# Patient Record
Sex: Female | Born: 1960
Health system: Southern US, Community
[De-identification: ages and names within clinical notes are randomized; demographics above are authoritative.]

## PROBLEM LIST (undated history)

## (undated) ENCOUNTER — Ambulatory Visit (HOSPITAL_COMMUNITY): Admission: EM | Payer: Self-pay | Source: Home / Self Care

## (undated) DIAGNOSIS — T7840XA Allergy, unspecified, initial encounter: Secondary | ICD-10-CM

## (undated) DIAGNOSIS — J302 Other seasonal allergic rhinitis: Secondary | ICD-10-CM

## (undated) DIAGNOSIS — R002 Palpitations: Secondary | ICD-10-CM

## (undated) DIAGNOSIS — F419 Anxiety disorder, unspecified: Secondary | ICD-10-CM

## (undated) DIAGNOSIS — Z973 Presence of spectacles and contact lenses: Secondary | ICD-10-CM

## (undated) DIAGNOSIS — M1990 Unspecified osteoarthritis, unspecified site: Secondary | ICD-10-CM

## (undated) DIAGNOSIS — I10 Essential (primary) hypertension: Secondary | ICD-10-CM

## (undated) DIAGNOSIS — K219 Gastro-esophageal reflux disease without esophagitis: Secondary | ICD-10-CM

## (undated) DIAGNOSIS — D649 Anemia, unspecified: Secondary | ICD-10-CM

## (undated) HISTORY — DX: Gastro-esophageal reflux disease without esophagitis: K21.9

## (undated) HISTORY — DX: Anxiety disorder, unspecified: F41.9

## (undated) HISTORY — PX: COLONOSCOPY: SHX174

## (undated) HISTORY — DX: Unspecified osteoarthritis, unspecified site: M19.90

## (undated) HISTORY — DX: Palpitations: R00.2

## (undated) HISTORY — PX: TUBAL LIGATION: SHX77

## (undated) HISTORY — DX: Anemia, unspecified: D64.9

## (undated) HISTORY — DX: Allergy, unspecified, initial encounter: T78.40XA

---

## 1988-02-20 HISTORY — PX: BREAST SURGERY: SHX581

## 1997-09-18 ENCOUNTER — Emergency Department (HOSPITAL_COMMUNITY): Admission: EM | Admit: 1997-09-18 | Discharge: 1997-09-18 | Payer: Self-pay | Admitting: Emergency Medicine

## 2000-05-20 ENCOUNTER — Other Ambulatory Visit: Admission: RE | Admit: 2000-05-20 | Discharge: 2000-05-20 | Payer: Self-pay | Admitting: Obstetrics & Gynecology

## 2001-06-11 ENCOUNTER — Other Ambulatory Visit: Admission: RE | Admit: 2001-06-11 | Discharge: 2001-06-11 | Payer: Self-pay | Admitting: Obstetrics & Gynecology

## 2001-07-07 ENCOUNTER — Encounter (INDEPENDENT_AMBULATORY_CARE_PROVIDER_SITE_OTHER): Payer: Self-pay

## 2001-07-07 ENCOUNTER — Ambulatory Visit (HOSPITAL_COMMUNITY): Admission: RE | Admit: 2001-07-07 | Discharge: 2001-07-07 | Payer: Self-pay | Admitting: Obstetrics & Gynecology

## 2001-09-07 ENCOUNTER — Emergency Department (HOSPITAL_COMMUNITY): Admission: EM | Admit: 2001-09-07 | Discharge: 2001-09-07 | Payer: Self-pay | Admitting: Emergency Medicine

## 2001-09-07 ENCOUNTER — Encounter: Payer: Self-pay | Admitting: Emergency Medicine

## 2007-12-13 ENCOUNTER — Emergency Department (HOSPITAL_BASED_OUTPATIENT_CLINIC_OR_DEPARTMENT_OTHER): Admission: EM | Admit: 2007-12-13 | Discharge: 2007-12-13 | Payer: Self-pay | Admitting: Emergency Medicine

## 2008-05-11 ENCOUNTER — Emergency Department (HOSPITAL_COMMUNITY): Admission: EM | Admit: 2008-05-11 | Discharge: 2008-05-11 | Payer: Self-pay | Admitting: Emergency Medicine

## 2010-06-01 LAB — BASIC METABOLIC PANEL
BUN: 9 mg/dL (ref 6–23)
CO2: 27 mEq/L (ref 19–32)
Calcium: 8.9 mg/dL (ref 8.4–10.5)
Chloride: 107 mEq/L (ref 96–112)
Creatinine, Ser: 0.53 mg/dL (ref 0.4–1.2)
GFR calc Af Amer: >60 mL/min (ref >60)
GFR calc non Af Amer: >60 mL/min (ref >60)
Glucose, Bld: 94 mg/dL (ref 70–99)
Potassium: 4 mEq/L (ref 3.5–5.1)
Sodium: 138 mEq/L (ref 135–145)

## 2010-06-01 LAB — CBC
HCT: 36.1 % (ref 36.0–46.0)
Hemoglobin: 11.5 g/dL — ABNORMAL LOW (ref 12.0–15.0)
MCHC: 31.9 g/dL (ref 30.0–36.0)
MCV: 82.3 fL (ref 78.0–100.0)
Platelets: 240 10*3/uL (ref 150–400)
RBC: 4.38 MIL/uL (ref 3.87–5.11)
RDW: 13.6 % (ref 11.5–15.5)
WBC: 4.7 10*3/uL (ref 4.0–10.5)

## 2010-06-01 LAB — DIFFERENTIAL
Basophils Absolute: 0 10*3/uL (ref 0.0–0.1)
Basophils Relative: 1 % (ref 0–1)
Eosinophils Absolute: 0.1 10*3/uL (ref 0.0–0.7)
Eosinophils Relative: 2 % (ref 0–5)
Lymphocytes Relative: 36 % (ref 12–46)
Lymphs Abs: 1.7 10*3/uL (ref 0.7–4.0)
Monocytes Absolute: 0.2 10*3/uL (ref 0.1–1.0)
Monocytes Relative: 5 % (ref 3–12)
Neutro Abs: 2.7 10*3/uL (ref 1.7–7.7)
Neutrophils Relative %: 56 % (ref 43–77)

## 2010-06-01 LAB — CK TOTAL AND CKMB (NOT AT ARMC): Relative Index: 0.9 (ref 0.0–2.5)

## 2010-07-07 NOTE — Op Note (Signed)
[facility]  Patient:    Julie Mcdonald, Julie Mcdonald Visit Number: [id number] MRN: [id number]          Service Type: DSU Location: [facility] Attending Physician:  Lars Pinks Dictated by:   Caralyn Guile. Arlyce Dice, M.D. Proc. Date: 07/07/01 Admit Date:  07/07/2001 Discharge Date: 07/07/2001                             Operative Report  PREOPERATIVE DIAGNOSES:       Voluntary sterilization, chronic lower abdominal pain.  POSTOPERATIVE DIAGNOSES:      Dermoid cyst of the right ovary.  PROCEDURE:                    Laparoscopic tubal cautery of the right tube and laparoscopic oophorectomy.  SURGEON:                      Richard D. Arlyce Dice, M.D.  ANESTHESIA:                   General endotracheal.  ESTIMATED BLOOD LOSS:         20 cc.  FINDINGS:                     The left tube was absent due to previous salpingectomy for ectopic.  The left ovary was normal in appearance, but adherent to the left pelvic side wall.  The right ovary was enlarged with a 4-5 cm cyst and was very mobile and easily torsed.  The right tube appeared normal.  The rest of the pelvis was without signs of endometriosis or adhesions.  INDICATIONS:                  This is a 50 year old gravida 2, para 1 who requested permanent sterilization.  In addition, the patient complained of intermittent right and left lower quadrant pain.  Decision made to proceed with laparoscopic sterilization and in addition the patient was told that if source of her pain was identified and surgical correction was appropriate that this would be performed.  PROCEDURE:                    The patient was taken to the operating room, placed in a supine position.  A general endotracheal anesthesia was induced. She was then placed in the dorsal lithotomy position.  The abdomen, vagina, and perineum were prepped and draped in a sterile fashion.  The bladder was catheterized.  A Hulka tenaculum was introduced into  the endometrial cavity and fixed to the anterior lip of the cervix.  The Veress needle was introduced into the peritoneal cavity through the cul-de-sac.  The surgeon regowned and gloved.  A 5 mm trocar was introduced through the umbilicus into the pneumoperitoneum that had been created by the insertion of the Veress needle into the cul-de-sac.  The 5 mm scope was then placed in the umbilicus.  An accessory instrument was placed through a suprapubic stab wound.  The pelvis was viewed with the findings noted above.  The right fallopian tube was cauterized along a 3-4 cm length at the isthmic ampullary junction.  Attention was then turned to the left ovary which appeared to be normal.  The adhesions around the ovary which were fixing it to the pelvic side wall were lysed and cauterized when necessary so that at the end the ovary was free  and mobile. Attention was then turned to the right ovary which seemed to have a white appearing cystic structure within the contents of the ovary consistent with a dermoid cyst.  In addition, the ovary was easily torsed and flopped in and out of the cul-de-sac and the anterior portion of the broad ligament.  It was felt that this ovary was a liability.  It probably contained a dermoid cyst on appearance and in addition the mobility of it made it more likely to torse and to cause the intermittent chronic pain that the patient had been describing over the previous years.  Because the left ovary appeared to be normal and because of the possible risk of future adhesions or problems with laparoscopic ovarian cystectomy, a decision was made to proceed with a laparoscopic oophorectomy.  The ovary was grasped and the mesovarian was cauterized and cut leaving the fallopian tube behind and leaving the infundibulopelvic ligament behind.  The ovary was then placed in a specimen bag and the incision in the right lower quadrant was extended.  The bag was brought to the  surface. A trocar was used to pierce the ovarian capsule and aspirate the cyst contents without leakage into the peritoneum and then at this point after the ovary was decompressed it was easily removed.  The fascial defect in the right lower quadrant was closed with 0 Vicryl suture.  The skin over this defect was closed with subcuticular 4-0 Dexon suture.  Following the closure of the incision, the laparoscope was then reviewed and hemostasis was noted to be present.  The pelvis was irrigated with irrigant and the other two ports were removed.  The skin over these ports were closed with Steri-Strips.  The patient tolerated the procedure well and left the operating room in good condition. Dictated by:   Caralyn Guile Arlyce Dice, M.D. Attending Physician:  Lars Pinks DD:  07/07/01 TD:  07/09/01 Job: [id number]

## 2010-09-25 ENCOUNTER — Emergency Department (HOSPITAL_COMMUNITY): Payer: Self-pay

## 2010-09-25 ENCOUNTER — Emergency Department (HOSPITAL_COMMUNITY)
Admission: EM | Admit: 2010-09-25 | Discharge: 2010-09-25 | Disposition: A | Discharge status: Home / Self Care | Payer: Self-pay | Attending: Emergency Medicine | Admitting: Emergency Medicine

## 2010-09-25 DIAGNOSIS — M79609 Pain in unspecified limb: Secondary | ICD-10-CM | POA: Insufficient documentation

## 2010-09-25 DIAGNOSIS — M545 Low back pain, unspecified: Secondary | ICD-10-CM | POA: Insufficient documentation

## 2010-09-25 LAB — URINALYSIS, ROUTINE W REFLEX MICROSCOPIC
Hgb urine dipstick: NEGATIVE
Specific Gravity, Urine: 1.016 (ref 1.005–1.030)
Urobilinogen, UA: 0.2 mg/dL (ref 0.0–1.0)

## 2011-12-11 ENCOUNTER — Other Ambulatory Visit: Payer: Self-pay | Admitting: Obstetrics and Gynecology

## 2011-12-11 ENCOUNTER — Encounter (HOSPITAL_COMMUNITY): Payer: Self-pay

## 2011-12-11 ENCOUNTER — Ambulatory Visit (HOSPITAL_COMMUNITY)
Admission: RE | Admit: 2011-12-11 | Discharge: 2011-12-11 | Disposition: A | Discharge status: Home / Self Care | Payer: Self-pay | Source: Ambulatory Visit | Attending: Obstetrics and Gynecology | Admitting: Obstetrics and Gynecology

## 2011-12-11 VITALS — BP 130/82 | Temp 98.6°F | Ht 66.0 in | Wt 145.8 lb

## 2011-12-11 DIAGNOSIS — Z9889 Other specified postprocedural states: Secondary | ICD-10-CM | POA: Insufficient documentation

## 2011-12-11 DIAGNOSIS — N6321 Unspecified lump in the left breast, upper outer quadrant: Secondary | ICD-10-CM | POA: Insufficient documentation

## 2011-12-11 DIAGNOSIS — L819 Disorder of pigmentation, unspecified: Secondary | ICD-10-CM

## 2011-12-11 DIAGNOSIS — N6320 Unspecified lump in the left breast, unspecified quadrant: Secondary | ICD-10-CM

## 2011-12-11 DIAGNOSIS — Z1239 Encounter for other screening for malignant neoplasm of breast: Secondary | ICD-10-CM

## 2011-12-11 NOTE — Patient Instructions (Signed)
Taught patient how to perform BSE and gave educational materials to take home. Patient did not need a Pap smear today due to last Pap smear was 04/18/2009. Let her know BCCCP will cover Pap smears every 3 years unless has a history of abnormal Pap smears. Patient stated had an abnormal Pap smear 7-8 years ago. Asked patient if will sign an authorization for [place] to receive results from [facility] to determine if need next  Pap smear before 3 years. Patient referred to the [facility] for Diagnostic mammogram and possible left breast ultrasound. Appointment scheduled for Thursday, December 13, 2011 at 1350. Patient aware of appointment and will be there. Let patient know will follow up with her within the next couple weeks with results. Patient verbalized understanding.

## 2011-12-11 NOTE — Assessment & Plan Note (Signed)
Left breast lump at 1 o'clock 4 cm from the nipple. Patient referred to the [facility] for Diagnostic mammogram and possible left breast ultrasound. Appointment scheduled for Thursday, December 13, 2011 at 1350.

## 2011-12-11 NOTE — Progress Notes (Signed)
Complaints of left breast lump for 2-3 months and discoloration of skin at right outer breast for around 6 months. Last screening mammogram was in 2005 at [facility] per patient.  Pap Smear:    Pap smear not performed today. Patients last Pap smear was 04/18/2009 and normal. Per patient had an abnormal Pap smear around 7-8 years ago that required follow-up, ? Cryo or ? LEEP. Patient could not remember which procedure had for follow up but stated had one of them. Will request results from [facility]. Pap smear result above is in EPIC.  Physical exam: Breasts Breasts symmetrical. No skin abnormalities left breast. Skin discoloration observed at right outer breast near axilla. Per patient noticed around 6 months ago. No nipple retraction bilateral breasts. No nipple discharge bilateral breasts. No lymphadenopathy. No lumps palpated right breast. Palpated lump in the left breast at 1 o'clock 4 cm from the nipple. No complaints of pain or tenderness on exam. Patient referred to the [facility] for Diagnostic mammogram and possible left breast ultrasound. Appointment scheduled for Thursday, December 13, 2011 at 1350.        Pelvic/Bimanual No Pap smear completed today since last Pap smear was 04/18/2009 and normal. Pap smear not indicated per BCCCP guidelines.

## 2011-12-13 ENCOUNTER — Ambulatory Visit
Admission: RE | Admit: 2011-12-13 | Discharge: 2011-12-13 | Disposition: A | Discharge status: Home / Self Care | Payer: No Typology Code available for payment source | Source: Ambulatory Visit | Attending: Obstetrics and Gynecology | Admitting: Obstetrics and Gynecology

## 2011-12-13 ENCOUNTER — Other Ambulatory Visit: Payer: Self-pay | Admitting: Obstetrics and Gynecology

## 2011-12-13 DIAGNOSIS — L819 Disorder of pigmentation, unspecified: Secondary | ICD-10-CM

## 2011-12-13 DIAGNOSIS — N6320 Unspecified lump in the left breast, unspecified quadrant: Secondary | ICD-10-CM

## 2011-12-13 DIAGNOSIS — R921 Mammographic calcification found on diagnostic imaging of breast: Secondary | ICD-10-CM

## 2011-12-19 ENCOUNTER — Other Ambulatory Visit: Payer: Self-pay | Admitting: Obstetrics and Gynecology

## 2011-12-19 ENCOUNTER — Ambulatory Visit
Admission: RE | Admit: 2011-12-19 | Discharge: 2011-12-19 | Disposition: A | Discharge status: Home / Self Care | Payer: No Typology Code available for payment source | Source: Ambulatory Visit | Attending: Obstetrics and Gynecology | Admitting: Obstetrics and Gynecology

## 2011-12-19 ENCOUNTER — Other Ambulatory Visit: Payer: Self-pay

## 2011-12-19 DIAGNOSIS — N6320 Unspecified lump in the left breast, unspecified quadrant: Secondary | ICD-10-CM

## 2011-12-19 DIAGNOSIS — R921 Mammographic calcification found on diagnostic imaging of breast: Secondary | ICD-10-CM

## 2011-12-25 ENCOUNTER — Other Ambulatory Visit: Payer: Self-pay | Admitting: Obstetrics and Gynecology

## 2011-12-25 ENCOUNTER — Ambulatory Visit
Admission: RE | Admit: 2011-12-25 | Discharge: 2011-12-25 | Disposition: A | Discharge status: Home / Self Care | Payer: No Typology Code available for payment source | Source: Ambulatory Visit | Attending: Obstetrics and Gynecology | Admitting: Obstetrics and Gynecology

## 2011-12-25 DIAGNOSIS — R921 Mammographic calcification found on diagnostic imaging of breast: Secondary | ICD-10-CM

## 2012-01-08 ENCOUNTER — Encounter (INDEPENDENT_AMBULATORY_CARE_PROVIDER_SITE_OTHER): Payer: Self-pay | Admitting: General Surgery

## 2012-01-08 ENCOUNTER — Ambulatory Visit (INDEPENDENT_AMBULATORY_CARE_PROVIDER_SITE_OTHER): Payer: PRIVATE HEALTH INSURANCE | Admitting: General Surgery

## 2012-01-08 ENCOUNTER — Other Ambulatory Visit (INDEPENDENT_AMBULATORY_CARE_PROVIDER_SITE_OTHER): Payer: Self-pay | Admitting: General Surgery

## 2012-01-08 VITALS — BP 138/88 | HR 68 | Temp 98.2°F | Resp 16 | Ht 66.0 in | Wt 145.6 lb

## 2012-01-08 DIAGNOSIS — N6019 Diffuse cystic mastopathy of unspecified breast: Secondary | ICD-10-CM

## 2012-01-08 DIAGNOSIS — R921 Mammographic calcification found on diagnostic imaging of breast: Secondary | ICD-10-CM

## 2012-01-08 NOTE — Patient Instructions (Signed)
Plan for right breast wire localized lumpectomy 

## 2012-01-08 NOTE — Progress Notes (Signed)
Subjective:     Patient ID: Julie Mcdonald, female   DOB: 02/24/1960, 51 y.o.   MRN: [id number]  HPI We're asked to see the patient in consultation by Dr. Eagle to evaluate her for right breast calcifications. The patient is a 51-year-old black female who recently felt something in her left breast. She was evaluated with mammograms. The mammogram showed calcifications that appear to be abnormal in the medial right breast. The mass in the left breast was biopsied and came back as fibrocystic disease. She denies any breast pain. She denies any discharge from her nipple. She does have a history of a benign mass being removed from the right breast in the 1990s. She also notes some new discoloration under her right armpit that she has noticed for the last several months.  Review of Systems  Constitutional: Negative.   HENT: Negative.   Eyes: Negative.   Respiratory: Negative.   Cardiovascular: Negative.   Gastrointestinal: Negative.   Genitourinary: Negative.   Musculoskeletal: Negative.   Skin: Negative.   Neurological: Negative.   Hematological: Negative.   Psychiatric/Behavioral: Negative.        Objective:   Physical Exam  Constitutional: She is oriented to person, place, and time. She appears well-developed and well-nourished.  HENT:  Head: Normocephalic and atraumatic.  Eyes: Conjunctivae normal and EOM are normal. Pupils are equal, round, and reactive to light.  Neck: Normal range of motion. Neck supple.  Cardiovascular: Normal rate, regular rhythm and normal heart sounds.   Pulmonary/Chest: Effort normal and breath sounds normal.       The patient has dense fibrocystic like tissue centrally in both breasts which is symmetric. There is no discrete palpable mass in either breast. There is no palpable axillary or supraclavicular cervical lymphadenopathy.  Abdominal: Soft. Bowel sounds are normal. She exhibits no mass. There is no tenderness.  Musculoskeletal: Normal range of motion.   Lymphadenopathy:    She has no cervical adenopathy.  Neurological: She is alert and oriented to person, place, and time.  Skin: Skin is warm and dry.  Psychiatric: She has a normal mood and affect. Her behavior is normal.       Assessment:     The patient has abnormal appearing calcifications in the medial right breast that were unable to be stereotactically biopsied. Because of this she will need an open biopsy of this area. I've discussed with her in detail the risks and benefits the operation to do this as well as some of the technical aspects and she understands and wishes to proceed    Plan:     Plan for right breast wire localized lumpectomy.      

## 2012-01-16 ENCOUNTER — Encounter (HOSPITAL_BASED_OUTPATIENT_CLINIC_OR_DEPARTMENT_OTHER): Payer: Self-pay | Admitting: *Deleted

## 2012-01-16 NOTE — Progress Notes (Signed)
No labs needed

## 2012-01-23 ENCOUNTER — Ambulatory Visit
Admission: RE | Admit: 2012-01-23 | Discharge: 2012-01-23 | Disposition: A | Discharge status: Home / Self Care | Payer: No Typology Code available for payment source | Source: Ambulatory Visit | Attending: General Surgery | Admitting: General Surgery

## 2012-01-23 ENCOUNTER — Ambulatory Visit (HOSPITAL_BASED_OUTPATIENT_CLINIC_OR_DEPARTMENT_OTHER): Payer: Self-pay | Admitting: Anesthesiology

## 2012-01-23 ENCOUNTER — Encounter (HOSPITAL_BASED_OUTPATIENT_CLINIC_OR_DEPARTMENT_OTHER): Payer: Self-pay | Admitting: Anesthesiology

## 2012-01-23 ENCOUNTER — Ambulatory Visit (HOSPITAL_BASED_OUTPATIENT_CLINIC_OR_DEPARTMENT_OTHER)
Admission: RE | Admit: 2012-01-23 | Discharge: 2012-01-23 | Disposition: A | Discharge status: Home / Self Care | Payer: Self-pay | Source: Ambulatory Visit | Attending: General Surgery | Admitting: General Surgery

## 2012-01-23 ENCOUNTER — Encounter (HOSPITAL_BASED_OUTPATIENT_CLINIC_OR_DEPARTMENT_OTHER)
Admission: RE | Disposition: A | Discharge status: Home / Self Care | Payer: Self-pay | Source: Ambulatory Visit | Attending: General Surgery

## 2012-01-23 ENCOUNTER — Encounter (HOSPITAL_BASED_OUTPATIENT_CLINIC_OR_DEPARTMENT_OTHER): Payer: Self-pay

## 2012-01-23 DIAGNOSIS — R921 Mammographic calcification found on diagnostic imaging of breast: Secondary | ICD-10-CM

## 2012-01-23 DIAGNOSIS — N6019 Diffuse cystic mastopathy of unspecified breast: Secondary | ICD-10-CM

## 2012-01-23 HISTORY — DX: Presence of spectacles and contact lenses: Z97.3

## 2012-01-23 HISTORY — PX: BREAST LUMPECTOMY WITH NEEDLE LOCALIZATION: SHX5759

## 2012-01-23 HISTORY — DX: Other seasonal allergic rhinitis: J30.2

## 2012-01-23 SURGERY — BREAST LUMPECTOMY WITH NEEDLE LOCALIZATION
Anesthesia: General | Site: Breast | Laterality: Right | Wound class: Clean

## 2012-01-23 MED ORDER — MIDAZOLAM HCL 2 MG/2ML IJ SOLN: 1.0000 mg | INTRAMUSCULAR | Status: DC | PRN

## 2012-01-23 MED ORDER — OXYCODONE HCL 5 MG PO TABS
5.0000 mg | ORAL_TABLET | Freq: Once | ORAL | Status: AC | PRN
  Administered 2012-01-23: 5 mg via ORAL

## 2012-01-23 MED ORDER — MIDAZOLAM HCL 5 MG/5ML IJ SOLN
INTRAMUSCULAR | Status: DC | PRN
  Administered 2012-01-23: 2 mg via INTRAVENOUS

## 2012-01-23 MED ORDER — BUPIVACAINE HCL (PF) 0.25 % IJ SOLN
INTRAMUSCULAR | Status: DC | PRN
  Administered 2012-01-23: 19 mL

## 2012-01-23 MED ORDER — CEFAZOLIN SODIUM-DEXTROSE 2-3 GM-% IV SOLR
2.0000 g | INTRAVENOUS | Status: AC
  Administered 2012-01-23: 2 g via INTRAVENOUS

## 2012-01-23 MED ORDER — PROPOFOL 10 MG/ML IV BOLUS
INTRAVENOUS | Status: DC | PRN
  Administered 2012-01-23: 200 mg via INTRAVENOUS

## 2012-01-23 MED ORDER — HYDROCODONE-ACETAMINOPHEN 5-325 MG PO TABS: 1.0000 | ORAL_TABLET | Freq: Four times a day (QID) | ORAL | Status: DC | PRN

## 2012-01-23 MED ORDER — OXYCODONE HCL 5 MG/5ML PO SOLN: 5.0000 mg | Freq: Once | ORAL | Status: AC | PRN

## 2012-01-23 MED ORDER — DEXAMETHASONE SODIUM PHOSPHATE 4 MG/ML IJ SOLN
INTRAMUSCULAR | Status: DC | PRN
  Administered 2012-01-23: 10 mg via INTRAVENOUS

## 2012-01-23 MED ORDER — CHLORHEXIDINE GLUCONATE 4 % EX LIQD: 1.0000 "application " | Freq: Once | CUTANEOUS | Status: DC

## 2012-01-23 MED ORDER — FENTANYL CITRATE 0.05 MG/ML IJ SOLN: 50.0000 ug | INTRAMUSCULAR | Status: DC | PRN

## 2012-01-23 MED ORDER — LIDOCAINE HCL (CARDIAC) 20 MG/ML IV SOLN
INTRAVENOUS | Status: DC | PRN
  Administered 2012-01-23: 80 mg via INTRAVENOUS

## 2012-01-23 MED ORDER — ONDANSETRON HCL 4 MG/2ML IJ SOLN
INTRAMUSCULAR | Status: DC | PRN
  Administered 2012-01-23: 4 mg via INTRAVENOUS

## 2012-01-23 MED ORDER — LACTATED RINGERS IV SOLN
INTRAVENOUS | Status: DC
  Administered 2012-01-23 (×2): via INTRAVENOUS

## 2012-01-23 MED ORDER — MEPERIDINE HCL 25 MG/ML IJ SOLN: 6.2500 mg | INTRAMUSCULAR | Status: DC | PRN

## 2012-01-23 MED ORDER — PROMETHAZINE HCL 25 MG/ML IJ SOLN: 6.2500 mg | INTRAMUSCULAR | Status: DC | PRN

## 2012-01-23 MED ORDER — FENTANYL CITRATE 0.05 MG/ML IJ SOLN
INTRAMUSCULAR | Status: DC | PRN
  Administered 2012-01-23: 50 ug via INTRAVENOUS
  Administered 2012-01-23 (×2): 25 ug via INTRAVENOUS

## 2012-01-23 MED ORDER — HYDROMORPHONE HCL PF 1 MG/ML IJ SOLN
0.2500 mg | INTRAMUSCULAR | Status: DC | PRN
  Administered 2012-01-23: 0.5 mg via INTRAVENOUS

## 2012-01-23 SURGICAL SUPPLY — 42 items
BLADE SURG 10 STRL SS (BLADE) ×2 IMPLANT
BLADE SURG 15 STRL LF DISP TIS (BLADE) ×1 IMPLANT
BLADE SURG 15 STRL SS (BLADE) ×2
CANISTER SUCTION 1200CC (MISCELLANEOUS) ×2 IMPLANT
CHLORAPREP W/TINT 26ML (MISCELLANEOUS) ×2 IMPLANT
CLIP TI WIDE RED SMALL 6 (CLIP) IMPLANT
CLOTH BEACON ORANGE TIMEOUT ST (SAFETY) ×2 IMPLANT
COVER MAYO STAND STRL (DRAPES) ×2 IMPLANT
COVER TABLE BACK 60X90 (DRAPES) ×2 IMPLANT
DECANTER SPIKE VIAL GLASS SM (MISCELLANEOUS) IMPLANT
DERMABOND ADVANCED (GAUZE/BANDAGES/DRESSINGS) ×1
DERMABOND ADVANCED .7 DNX12 (GAUZE/BANDAGES/DRESSINGS) ×1 IMPLANT
DEVICE DUBIN W/COMP PLATE 8390 (MISCELLANEOUS) ×2 IMPLANT
DRAPE LAPAROSCOPIC ABDOMINAL (DRAPES) ×2 IMPLANT
DRAPE UTILITY XL STRL (DRAPES) ×2 IMPLANT
ELECT COATED BLADE 2.86 ST (ELECTRODE) ×2 IMPLANT
ELECT REM PT RETURN 9FT ADLT (ELECTROSURGICAL) ×2
ELECTRODE REM PT RTRN 9FT ADLT (ELECTROSURGICAL) ×1 IMPLANT
GLOVE BIO SURGEON STRL SZ7.5 (GLOVE) ×4 IMPLANT
GLOVE BIOGEL PI IND STRL 8 (GLOVE) ×1 IMPLANT
GLOVE BIOGEL PI INDICATOR 8 (GLOVE) ×1
GOWN PREVENTION PLUS XLARGE (GOWN DISPOSABLE) ×2 IMPLANT
GOWN PREVENTION PLUS XXLARGE (GOWN DISPOSABLE) ×2 IMPLANT
NEEDLE HYPO 25X1 1.5 SAFETY (NEEDLE) ×2 IMPLANT
NS IRRIG 1000ML POUR BTL (IV SOLUTION) ×2 IMPLANT
PACK BASIN DAY SURGERY FS (CUSTOM PROCEDURE TRAY) ×2 IMPLANT
PENCIL BUTTON HOLSTER BLD 10FT (ELECTRODE) ×2 IMPLANT
SLEEVE SCD COMPRESS KNEE MED (MISCELLANEOUS) ×2 IMPLANT
SPONGE LAP 18X18 X RAY DECT (DISPOSABLE) ×2 IMPLANT
STAPLER VISISTAT 35W (STAPLE) IMPLANT
SUT MON AB 4-0 PC3 18 (SUTURE) ×2 IMPLANT
SUT SILK 2 0 SH (SUTURE) ×2 IMPLANT
SUT VIC AB 3-0 54X BRD REEL (SUTURE) IMPLANT
SUT VIC AB 3-0 BRD 54 (SUTURE)
SUT VIC AB 3-0 SH 27 (SUTURE) ×1
SUT VIC AB 3-0 SH 27X BRD (SUTURE) ×1 IMPLANT
SYR CONTROL 10ML LL (SYRINGE) ×2 IMPLANT
TOWEL OR 17X24 6PK STRL BLUE (TOWEL DISPOSABLE) ×2 IMPLANT
TOWEL OR NON WOVEN STRL DISP B (DISPOSABLE) ×2 IMPLANT
TUBE CONNECTING 20X1/4 (TUBING) ×2 IMPLANT
WATER STERILE IRR 1000ML POUR (IV SOLUTION) IMPLANT
YANKAUER SUCT BULB TIP NO VENT (SUCTIONS) ×2 IMPLANT

## 2012-01-23 NOTE — H&P (View-Only) (Signed)
Subjective:     Patient ID: Julie Mcdonald, female   DOB: 10-16-1960, 51 y.o.   MRN: [id number]  HPI We're asked to see the patient in consultation by Dr. Deboraha Sprang to evaluate her for right breast calcifications. The patient is a 51 year old black female who recently felt something in her left breast. She was evaluated with mammograms. The mammogram showed calcifications that appear to be abnormal in the medial right breast. The mass in the left breast was biopsied and came back as fibrocystic disease. She denies any breast pain. She denies any discharge from her nipple. She does have a history of a benign mass being removed from the right breast in the 1990s. She also notes some new discoloration under her right armpit that she has noticed for the last several months.  Review of Systems  Constitutional: Negative.   HENT: Negative.   Eyes: Negative.   Respiratory: Negative.   Cardiovascular: Negative.   Gastrointestinal: Negative.   Genitourinary: Negative.   Musculoskeletal: Negative.   Skin: Negative.   Neurological: Negative.   Hematological: Negative.   Psychiatric/Behavioral: Negative.        Objective:   Physical Exam  Constitutional: She is oriented to person, place, and time. She appears well-developed and well-nourished.  HENT:  Head: Normocephalic and atraumatic.  Eyes: Conjunctivae normal and EOM are normal. Pupils are equal, round, and reactive to light.  Neck: Normal range of motion. Neck supple.  Cardiovascular: Normal rate, regular rhythm and normal heart sounds.   Pulmonary/Chest: Effort normal and breath sounds normal.       The patient has dense fibrocystic like tissue centrally in both breasts which is symmetric. There is no discrete palpable mass in either breast. There is no palpable axillary or supraclavicular cervical lymphadenopathy.  Abdominal: Soft. Bowel sounds are normal. She exhibits no mass. There is no tenderness.  Musculoskeletal: Normal range of motion.   Lymphadenopathy:    She has no cervical adenopathy.  Neurological: She is alert and oriented to person, place, and time.  Skin: Skin is warm and dry.  Psychiatric: She has a normal mood and affect. Her behavior is normal.       Assessment:     The patient has abnormal appearing calcifications in the medial right breast that were unable to be stereotactically biopsied. Because of this she will need an open biopsy of this area. I've discussed with her in detail the risks and benefits the operation to do this as well as some of the technical aspects and she understands and wishes to proceed    Plan:     Plan for right breast wire localized lumpectomy.

## 2012-01-23 NOTE — Interval H&P Note (Signed)
History and Physical Interval Note:  01/23/2012 9:30 AM  Julie Mcdonald  has presented today for surgery, with the diagnosis of right breast calcification  The various methods of treatment have been discussed with the patient and family. After consideration of risks, benefits and other options for treatment, the patient has consented to  Procedure(s) (LRB) with comments: BREAST LUMPECTOMY WITH NEEDLE LOCALIZATION (Right) as a surgical intervention .  The patient's history has been reviewed, patient examined, no change in status, stable for surgery.  I have reviewed the patient's chart and labs.  Questions were answered to the patient's satisfaction.     TOTH III,PAUL S

## 2012-01-23 NOTE — Op Note (Signed)
01/23/2012  11:02 AM  PATIENT:  Julie Mcdonald  51 y.o. female  PRE-OPERATIVE DIAGNOSIS:  right breast calcification  POST-OPERATIVE DIAGNOSIS:  right breast calcification  PROCEDURE:  Procedure(s) (LRB) with comments: BREAST LUMPECTOMY WITH NEEDLE LOCALIZATION (Right)  SURGEON:  Surgeon(s) and Role:    * Robyne Askew, MD - Primary  PHYSICIAN ASSISTANT:   ASSISTANTS: none   ANESTHESIA:   general  EBL:  Total I/O In: 1000 [I.V.:1000] Out: -   BLOOD ADMINISTERED:none  DRAINS: none   LOCAL MEDICATIONS USED:  MARCAINE     SPECIMEN:  Source of Specimen:  right breast tissue  DISPOSITION OF SPECIMEN:  PATHOLOGY  COUNTS:  YES  TOURNIQUET:  * No tourniquets in log *  DICTATION: .Dragon Dictation After informed consent was obtained patient was brought to the operating room and placed in the supine position on the operating room table. After adequate induction general anesthesia the patient's right breast was prepped with ChloraPrep, allowed to dry, and draped in usual sterile manner. Earlier in the day the patient underwent a wire localization procedure and the wire was entering the right breast medially and headed laterally. A radial incision was made on the medial right breast with a 15 blade knife. This incision was carried through the skin and subcutaneous tissue sharply with the electrocautery. Once into the breast tissue the path of the wire could be palpated. A circular portion of breast tissue was excised sharply around half of the wire with the electrocautery. The dissection was carried all the way to the chest wall. Once the specimen was removed it was oriented with a short stitch on the superior margin and a long stitch on the lateral margin. The specimen radiograph showed that the calcifications were in the specimen. The specimen was then sent to pathology for further evaluation. Hemostasis was achieved using the electrocautery. The wound is irrigated with copious  amounts of saline and infiltrated with quarter percent Marcaine. The deep layer the wound was closed with interrupted 3-0 Vicryl stitches. The skin was closed with interrupted 4 Monocryl subcuticular stitches. Dermabond dressings were applied. The patient tolerated the procedure well. At the end of the case all needle sponge and instrument counts are correct. The patient was then awakened and taken to recovery in stable condition.  PLAN OF CARE: Discharge to home after PACU  PATIENT DISPOSITION:  PACU - hemodynamically stable.   Delay start of Pharmacological VTE agent (>24hrs) due to surgical blood loss or risk of bleeding: not applicable

## 2012-01-23 NOTE — Anesthesia Preprocedure Evaluation (Signed)
Anesthesia Evaluation  Patient identified by MRN, date of birth, ID band Patient awake    Reviewed: Allergy & Precautions, H&P , NPO status , Patient's Chart, lab work & pertinent test results  History of Anesthesia Complications Negative for: history of anesthetic complications  Airway Mallampati: I  Neck ROM: full    Dental No notable dental hx.    Pulmonary neg pulmonary ROS,  breath sounds clear to auscultation  Pulmonary exam normal       Cardiovascular negative cardio ROS  IRhythm:regular Rate:Normal     Neuro/Psych negative neurological ROS  negative psych ROS   GI/Hepatic negative GI ROS, Neg liver ROS,   Endo/Other  negative endocrine ROS  Renal/GU negative Renal ROS  negative genitourinary   Musculoskeletal   Abdominal   Peds  Hematology negative hematology ROS (+)   Anesthesia Other Findings   Reproductive/Obstetrics negative OB ROS                           Anesthesia Physical Anesthesia Plan  ASA: I  Anesthesia Plan: General and General LMA   Post-op Pain Management:    Induction:   Airway Management Planned:   Additional Equipment:   Intra-op Plan:   Post-operative Plan:   Informed Consent: I have reviewed the patients History and Physical, chart, labs and discussed the procedure including the risks, benefits and alternatives for the proposed anesthesia with the patient or authorized representative who has indicated his/her understanding and acceptance.   Dental Advisory Given  Plan Discussed with: CRNA and Surgeon  Anesthesia Plan Comments:         Anesthesia Quick Evaluation

## 2012-01-23 NOTE — Anesthesia Postprocedure Evaluation (Signed)
  Anesthesia Post-op Note  Patient: Julie Mcdonald  Procedure(s) Performed: Procedure(s) (LRB) with comments: BREAST LUMPECTOMY WITH NEEDLE LOCALIZATION (Right)  Patient Location: PACU  Anesthesia Type:General  Level of Consciousness: awake  Airway and Oxygen Therapy: Patient Spontanous Breathing  Post-op Pain: mild  Post-op Assessment: Post-op Vital signs reviewed  Post-op Vital Signs: stable  Complications: No apparent anesthesia complications

## 2012-01-23 NOTE — Anesthesia Procedure Notes (Signed)
Procedure Name: LMA Insertion Date/Time: 01/23/2012 10:17 AM Performed by: Gar Gibbon Pre-anesthesia Checklist: Patient identified, Emergency Drugs available, Suction available and Patient being monitored Patient Re-evaluated:Patient Re-evaluated prior to inductionOxygen Delivery Method: Circle System Utilized Preoxygenation: Pre-oxygenation with 100% oxygen Intubation Type: IV induction Ventilation: Mask ventilation without difficulty LMA: LMA inserted LMA Size: 4.0 Number of attempts: 1 Airway Equipment and Method: bite block Placement Confirmation: positive ETCO2 Tube secured with: Tape Dental Injury: Teeth and Oropharynx as per pre-operative assessment and Bloody posterior oropharynx  Difficulty Due To: Difficulty was anticipated and Difficult Airway- due to dentition Comments: Attempts x 2 small mouth, dentition appears frigile. Placed by Dr T. Massage

## 2012-01-23 NOTE — Transfer of Care (Signed)
Immediate Anesthesia Transfer of Care Note  Patient: Julie Mcdonald  Procedure(s) Performed: Procedure(s) (LRB) with comments: BREAST LUMPECTOMY WITH NEEDLE LOCALIZATION (Right)  Patient Location: PACU  Anesthesia Type:General  Level of Consciousness: awake and sedated  Airway & Oxygen Therapy: Patient Spontanous Breathing and Patient connected to face mask oxygen  Post-op Assessment: Report given to PACU RN and Post -op Vital signs reviewed and stable  Post vital signs: Reviewed and stable  Complications: No apparent anesthesia complications

## 2012-01-24 ENCOUNTER — Encounter (HOSPITAL_BASED_OUTPATIENT_CLINIC_OR_DEPARTMENT_OTHER): Payer: Self-pay | Admitting: General Surgery

## 2012-01-24 LAB — POCT HEMOGLOBIN-HEMACUE: Hemoglobin: 12.4 g/dL (ref 12.0–15.0)

## 2012-01-25 ENCOUNTER — Telehealth (INDEPENDENT_AMBULATORY_CARE_PROVIDER_SITE_OTHER): Payer: Self-pay | Admitting: General Surgery

## 2012-01-25 NOTE — Telephone Encounter (Signed)
Spoke with pt and informed her of her pathology being benign fibrocystic tissue.  She was very pleased with this.

## 2012-01-25 NOTE — Telephone Encounter (Signed)
Message copied by Littie Deeds on Fri Jan 25, 2012  9:05 AM ------      Message from: Caleen Essex III      Created: Fri Jan 25, 2012  8:50 AM       All benign fibrocystic tissue. No cancer

## 2012-02-11 ENCOUNTER — Encounter (INDEPENDENT_AMBULATORY_CARE_PROVIDER_SITE_OTHER): Payer: PRIVATE HEALTH INSURANCE | Admitting: General Surgery

## 2012-02-21 NOTE — Addendum Note (Signed)
Encounter addended by: Saintclair Halsted, RN on: 02/21/2012  3:34 PM<BR>     Documentation filed: Visit Diagnoses, Charges VN

## 2012-02-28 ENCOUNTER — Telehealth (INDEPENDENT_AMBULATORY_CARE_PROVIDER_SITE_OTHER): Payer: Self-pay

## 2012-02-28 NOTE — Telephone Encounter (Signed)
Message copied by Brennan Bailey on Thu Feb 28, 2012 10:11 AM ------      Message from: [place], [place]      Created: Thu Feb 28, 2012  9:52 AM      Contact: [phone]                   ----- Message -----         From: Zollie Scale         Sent: 02/28/2012   9:01 AM           To: Littie Deeds            Apt had to be changed due to change in doctors schedule. This will be her 1st po can you please find her a sooner apt first available is 04/01/12

## 2012-02-28 NOTE — Telephone Encounter (Signed)
Called pt and gave her appt for 2/3 @ 10.

## 2012-03-18 ENCOUNTER — Encounter (INDEPENDENT_AMBULATORY_CARE_PROVIDER_SITE_OTHER): Payer: PRIVATE HEALTH INSURANCE | Admitting: General Surgery

## 2012-03-24 ENCOUNTER — Ambulatory Visit (INDEPENDENT_AMBULATORY_CARE_PROVIDER_SITE_OTHER): Payer: PRIVATE HEALTH INSURANCE | Admitting: General Surgery

## 2012-03-24 ENCOUNTER — Encounter (INDEPENDENT_AMBULATORY_CARE_PROVIDER_SITE_OTHER): Payer: Self-pay | Admitting: General Surgery

## 2012-03-24 VITALS — BP 122/82 | HR 83 | Temp 97.8°F | Resp 18 | Ht 66.0 in | Wt 149.4 lb

## 2012-03-24 DIAGNOSIS — R928 Other abnormal and inconclusive findings on diagnostic imaging of breast: Secondary | ICD-10-CM

## 2012-03-24 DIAGNOSIS — R921 Mammographic calcification found on diagnostic imaging of breast: Secondary | ICD-10-CM

## 2012-03-24 NOTE — Patient Instructions (Signed)
Continue regular self exams  

## 2012-04-16 NOTE — Progress Notes (Signed)
Subjective:     Patient ID: Julie Mcdonald, female   DOB: [birth year], 52 y.o.   MRN: [id number]  HPI The patient is a 52 year old female who is about 3 months status post right lumpectomy. Her pathology showed fibrocystic changes but no evidence of malignancy or atypia. She denies any breast pain.  Review of Systems     Objective:   Physical Exam On exam her right lumpectomy incision is healing nicely with no sign of infection or significant seroma    Assessment:     The patient is 3 months status post right lumpectomy for benign disease     Plan:     At this point she will continue to do regular self exams. She will continue to keep her yearly medical doctor followup appointments and yearly mammogram scheduled. We will plan to see her back on a when necessary basis

## 2012-06-24 ENCOUNTER — Encounter (HOSPITAL_COMMUNITY): Payer: Self-pay

## 2012-06-24 ENCOUNTER — Ambulatory Visit (HOSPITAL_COMMUNITY)
Admission: RE | Admit: 2012-06-24 | Discharge: 2012-06-24 | Disposition: A | Discharge status: Home / Self Care | Payer: Self-pay | Source: Ambulatory Visit | Attending: Obstetrics and Gynecology | Admitting: Obstetrics and Gynecology

## 2012-06-24 VITALS — BP 118/62 | Temp 98.0°F | Ht 66.0 in | Wt 144.6 lb

## 2012-06-24 DIAGNOSIS — Z01419 Encounter for gynecological examination (general) (routine) without abnormal findings: Secondary | ICD-10-CM

## 2012-06-24 NOTE — Progress Notes (Signed)
No complaints today.  Pap Smear:    Pap smear completed today. Patients last Pap smear was 04/18/2009 and normal. Per patient has a history of an abnormal Pap in 2009 that required a colpsocopy and a LEEP for follow up. Pap smear result above is in EPIC.         Pelvic/Bimanual   Ext Genitalia No lesions, no swelling and no discharge observed on external genitalia.         Vagina Vagina pink and normal texture. No lesions and dried brown vaginal blood observed in vagina.          Cervix Cervix is present. Cervix pink and of normal texture. Dried brown vaginal blood observed on cervical os.     Uterus Uterus is present and palpable. Uterus in normal position and normal size.        Adnexae Bilateral ovaries present and palpable. No tenderness on palpation.          Rectovaginal No rectal exam completed today since patient had no rectal complaints. No skin abnormalities observed on exam.

## 2012-06-24 NOTE — Patient Instructions (Signed)
Taught patient how to perform BSE.  Let her know her next Pap smear will be due depending on the results to today's Pap smear due to her history of an abnormal Pap smear. Let patient know will follow up with her within the next couple weeks with results. Patient verbalized understanding.

## 2012-06-26 ENCOUNTER — Telehealth (HOSPITAL_COMMUNITY): Payer: Self-pay | Admitting: *Deleted

## 2012-06-26 NOTE — Telephone Encounter (Signed)
Telephoned patient at home # and mailbox is full and not able to leave message

## 2012-07-03 ENCOUNTER — Telehealth (HOSPITAL_COMMUNITY): Payer: Self-pay | Admitting: *Deleted

## 2012-07-03 NOTE — Telephone Encounter (Signed)
Patient called wanting to know her Pap smear results. Let her know that her Pap smear is normal. Since per patient this is has only had one Pap smear since her LEEP she will need another Pap smear in 1 year. Told patient she will need 3 normal Pap smears in a row before it is recommended to have at longer intervals 2-3 years. Patient verbalized understanding.

## 2013-03-09 ENCOUNTER — Other Ambulatory Visit: Payer: Self-pay | Admitting: Internal Medicine

## 2013-03-09 DIAGNOSIS — R609 Edema, unspecified: Secondary | ICD-10-CM

## 2013-03-10 ENCOUNTER — Ambulatory Visit
Admission: RE | Admit: 2013-03-10 | Discharge: 2013-03-10 | Disposition: A | Discharge status: Home / Self Care | Payer: BC Managed Care – PPO | Source: Ambulatory Visit | Attending: Internal Medicine | Admitting: Internal Medicine

## 2013-03-10 DIAGNOSIS — R609 Edema, unspecified: Secondary | ICD-10-CM

## 2013-04-21 ENCOUNTER — Other Ambulatory Visit: Payer: Self-pay | Admitting: Internal Medicine

## 2013-04-21 ENCOUNTER — Other Ambulatory Visit (HOSPITAL_COMMUNITY)
Admission: RE | Admit: 2013-04-21 | Discharge: 2013-04-21 | Disposition: A | Discharge status: Home / Self Care | Payer: BC Managed Care – PPO | Source: Ambulatory Visit | Attending: Internal Medicine | Admitting: Internal Medicine

## 2013-04-21 DIAGNOSIS — Z1231 Encounter for screening mammogram for malignant neoplasm of breast: Secondary | ICD-10-CM

## 2013-04-21 DIAGNOSIS — R87810 Cervical high risk human papillomavirus (HPV) DNA test positive: Secondary | ICD-10-CM | POA: Insufficient documentation

## 2013-04-21 DIAGNOSIS — Z1151 Encounter for screening for human papillomavirus (HPV): Secondary | ICD-10-CM | POA: Insufficient documentation

## 2013-04-21 DIAGNOSIS — Z124 Encounter for screening for malignant neoplasm of cervix: Secondary | ICD-10-CM | POA: Insufficient documentation

## 2013-05-11 ENCOUNTER — Ambulatory Visit
Admission: RE | Admit: 2013-05-11 | Discharge: 2013-05-11 | Disposition: A | Discharge status: Home / Self Care | Payer: BC Managed Care – PPO | Source: Ambulatory Visit | Attending: Internal Medicine | Admitting: Internal Medicine

## 2013-05-11 DIAGNOSIS — Z1231 Encounter for screening mammogram for malignant neoplasm of breast: Secondary | ICD-10-CM

## 2013-12-21 ENCOUNTER — Encounter (HOSPITAL_COMMUNITY): Payer: Self-pay

## 2013-12-28 ENCOUNTER — Emergency Department (HOSPITAL_COMMUNITY): Payer: BC Managed Care – PPO

## 2013-12-28 ENCOUNTER — Emergency Department (HOSPITAL_COMMUNITY)
Admission: EM | Admit: 2013-12-28 | Discharge: 2013-12-28 | Disposition: A | Discharge status: Home / Self Care | Payer: BC Managed Care – PPO | Attending: Emergency Medicine | Admitting: Emergency Medicine

## 2013-12-28 ENCOUNTER — Encounter (HOSPITAL_COMMUNITY): Payer: Self-pay | Admitting: Emergency Medicine

## 2013-12-28 DIAGNOSIS — R0789 Other chest pain: Secondary | ICD-10-CM | POA: Insufficient documentation

## 2013-12-28 DIAGNOSIS — R0602 Shortness of breath: Secondary | ICD-10-CM | POA: Diagnosis not present

## 2013-12-28 DIAGNOSIS — R079 Chest pain, unspecified: Secondary | ICD-10-CM | POA: Diagnosis present

## 2013-12-28 LAB — CBC WITH DIFFERENTIAL/PLATELET
Basophils Absolute: 0 10*3/uL (ref 0.0–0.1)
Basophils Relative: 1 % (ref 0–1)
Eosinophils Absolute: 0.1 10*3/uL (ref 0.0–0.7)
Eosinophils Relative: 2 % (ref 0–5)
HCT: 38.8 % (ref 36.0–46.0)
Hemoglobin: 12.2 g/dL (ref 12.0–15.0)
Lymphocytes Relative: 46 % (ref 12–46)
Lymphs Abs: 1.7 10*3/uL (ref 0.7–4.0)
MCH: 26.2 pg (ref 26.0–34.0)
MCHC: 31.4 g/dL (ref 30.0–36.0)
MCV: 83.3 fL (ref 78.0–100.0)
Monocytes Absolute: 0.2 10*3/uL (ref 0.1–1.0)
Monocytes Relative: 4 % (ref 3–12)
Neutro Abs: 1.8 10*3/uL (ref 1.7–7.7)
Neutrophils Relative %: 47 % (ref 43–77)
Platelets: 226 10*3/uL (ref 150–400)
RBC: 4.66 MIL/uL (ref 3.87–5.11)
RDW: 13.8 % (ref 11.5–15.5)
WBC: 3.7 10*3/uL — ABNORMAL LOW (ref 4.0–10.5)

## 2013-12-28 LAB — BASIC METABOLIC PANEL
Anion gap: 13 (ref 5–15)
BUN: 11 mg/dL (ref 6–23)
CO2: 26 mEq/L (ref 19–32)
Calcium: 9.2 mg/dL (ref 8.4–10.5)
Chloride: 101 mEq/L (ref 96–112)
Creatinine, Ser: 0.7 mg/dL (ref 0.50–1.10)
GFR calc Af Amer: >90 mL/min (ref >90)
GFR calc non Af Amer: >90 mL/min (ref >90)
Glucose, Bld: 83 mg/dL (ref 70–99)
Potassium: 4.1 mEq/L (ref 3.7–5.3)
Sodium: 140 mEq/L (ref 137–147)

## 2013-12-28 LAB — I-STAT TROPONIN, ED: Troponin i, poc: 0.01 ng/mL (ref 0.00–0.08)

## 2013-12-28 LAB — D-DIMER, QUANTITATIVE (NOT AT ARMC): D DIMER QUANT: 0.36 ug{FEU}/mL (ref 0.00–0.48)

## 2013-12-28 LAB — TROPONIN I

## 2013-12-28 MED ORDER — KETOROLAC TROMETHAMINE 15 MG/ML IJ SOLN
15.0000 mg | Freq: Once | INTRAMUSCULAR | Status: AC
  Administered 2013-12-28: 15 mg via INTRAMUSCULAR

## 2013-12-28 MED ORDER — ASPIRIN 81 MG PO CHEW
324.0000 mg | CHEWABLE_TABLET | Freq: Once | ORAL | Status: AC
Start: 2013-12-28 — End: 2013-12-28
  Administered 2013-12-28: 324 mg via ORAL
  Filled 2013-12-28: qty 4

## 2013-12-28 MED ORDER — KETOROLAC TROMETHAMINE 15 MG/ML IJ SOLN
15.0000 mg | Freq: Once | INTRAMUSCULAR | Status: DC
  Filled 2013-12-28: qty 1

## 2013-12-28 NOTE — ED Notes (Signed)
Pt c/o left sided CP with SOB and left arm pain x 2 days

## 2013-12-28 NOTE — Consult Note (Signed)
Patient ID: INGRI DIEMER MRN: [id number], DOB/AGE: [birth year]   Admit date: 12/28/2013   Primary Physician: PROVIDER NOT IN SYSTEM Primary Cardiologist: new - seen by Corky Downs, MD   Pt. Profile:  Ms. Kama is a 53 year old female who is being evaluated today for chest pain and shortness of breath.  Problem List  Past Medical History  Diagnosis Date  . Seasonal allergies   . Wears glasses     Past Surgical History  Procedure Laterality Date  . Tubal ligation    . Breast surgery  1990    right breast  . Breast lumpectomy with needle localization  01/23/2012    Procedure: BREAST LUMPECTOMY WITH NEEDLE LOCALIZATION;  Surgeon: Merrie Roof, MD;  Location: [place];  Service: General;  Laterality: Right;     Allergies  No Known Allergies  HPI  Ms. Boshers is a 53 year old African American female who is being seen today for left sided chest and arm discomfort since last Wednesday.  She has no pertinent past medical history and has not experienced this type of discomfort before. She is an active individual who works out multiple times weekly at [employer] and maintains a mostly healthy diet. Her mother has a history of atrial fibrillation with rapid ventricular response that was treated with an ablation; and hypertension.   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 She's had this discomfort constantly since Wednesday with a few worsening episodes two days ago and then again this morning. She is usually able to work out at the gym without difficulty but has felt unusually fatigued and short of breath during her most recent work outs. She feels she cannot get comfortable in general, and was unable to sleep two nights ago, and took 800mg  of ibuprofen along with zyrtec which helped to reduce the discomfort. Pain is present both during  exertion and at rest, and at its worst is 8/10. The pain does not dissipate completely.  Pain does not change with palpation, deep breathing, coughing, position changes, or activity.  Due to ongoing Ss, she called her PCP today and was advised to come to the ED.  She is currently in pain at this time (5/10) and has only received Aspirin.  ECG is non-acute and troponin is nl x 1.    Home Medications  Prior to Admission medications   Medication Sig Start Date End Date Taking? Authorizing Provider  cetirizine (ZYRTEC) 10 MG tablet Take 10 mg by mouth daily.   Yes Historical Provider, MD  Cholecalciferol (VITAMIN D PO) Take 1 tablet by mouth daily.   Yes Historical Provider, MD  ibuprofen (ADVIL,MOTRIN) 800 MG tablet Take 800 mg by mouth daily as needed.   Yes Historical Provider, MD  IRON PO Take 1 tablet by mouth every other day.   Yes Historical Provider, MD  Menthol-Methyl Salicylate (MUSCLE RUB) 10-15 % CREA Apply 1 application topically as needed for muscle pain.   Yes Historical Provider, MD  Multiple Vitamin (MULTIVITAMIN WITH MINERALS) TABS tablet Take 1 tablet by mouth daily.   Yes Historical Provider, MD  Omega-3 Fatty Acids (OMEGA 3 PO) Take 1 capsule by mouth daily.   Yes Historical Provider, MD  Probiotic Product (PROBIOTIC DAILY PO) Take 1 tablet by mouth daily.   Yes Historical Provider, MD  ranitidine (ZANTAC) 150 MG capsule Take 150 mg by mouth as needed.    Historical Provider, MD    Family History  Family History  Problem Relation Age of Onset  . Heart disease Mother   . Hypertension Mother   . Diabetes Maternal Grandmother   . Heart disease Maternal Grandmother   . Cancer Maternal Grandmother     colon  . Diabetes Maternal Grandfather   . Heart disease Maternal Grandfather   . Cancer Maternal Grandfather     Social History  History   Social History  . Marital Status: Married    Spouse Name: N/A    Number of Children: N/A  . Years of Education: N/A    Occupational History  . Not on file.   Social History Main Topics  . Smoking status: Never Smoker   . Smokeless tobacco: Never Used  . Alcohol Use: No  . Drug Use: No  . Sexual Activity: Yes    Birth Control/ Protection: Surgical   Other Topics Concern  . Not on file   Social History Narrative   Lives locally with husband.  Works as Theme park manager.  Very active and works out in gym with trainer several x/wk.     Review of Systems General:  No chills, fever, night sweats or weight changes.  Cardiovascular:  +++ Chest pain and dyspnea on exertion and at rest since last Wed.  Currently 5/10. No edema, orthopnea, palpitations, paroxysmal nocturnal dyspnea. Dermatological: No rash, lesions/masses Respiratory: No cough, dyspnea Urologic: No hematuria, dysuria Abdominal:   No nausea, vomiting, diarrhea, bright red blood per rectum, melena, or hematemesis Neurologic:  No visual changes, wkns, changes in mental status. All other systems reviewed and are otherwise negative except as noted above.  Physical Exam  Blood pressure 136/76, pulse 72, temperature 97.6 F (36.4 C), temperature source Oral, resp. rate 24, height 5\' 6"  (1.676 m), weight 147 lb (66.679 kg), SpO2 100 %.  General: Pleasant, NAD Psych: Normal affect. Neuro: Alert and oriented X 3. Moves all extremities spontaneously. HEENT: Normal  Neck: Supple without bruits or JVD. Lungs:  Resp regular and unlabored, CTA. Heart: RRR no s3, s4, or murmurs. Abdomen: Soft, non-tender, non-distended, BS + x 4.  Extremities: No clubbing, cyanosis or edema. DP/PT/Radials 2+ and equal bilaterally.  Labs  Troponin [facility] of Care Test)  Recent Labs  12/28/13 1130  TROPIPOC 0.01   Lab Results  Component Value Date   WBC 3.7* 12/28/2013   HGB 12.2 12/28/2013   HCT 38.8 12/28/2013   MCV 83.3 12/28/2013   PLT 226 12/28/2013     Recent Labs Lab 12/28/13 1121  NA 140  K 4.1  CL 101  CO2 26  BUN 11  CREATININE 0.70   CALCIUM 9.2  GLUCOSE 83    Radiology/Studies  Dg Chest 2 View  12/28/2013   CLINICAL DATA:  Left chest pain and shortness of breath for 2 days.  EXAM: CHEST  2 VIEW  COMPARISON:  May 11, 2008  FINDINGS: The heart size and mediastinal contours are within normal limits. There is no focal infiltrate, pulmonary edema, or pleural effusion. The visualized skeletal structures are unremarkable.  IMPRESSION: No active cardiopulmonary disease.   Electronically Signed   By: Abelardo Diesel M.D.   On: 12/28/2013 12:14   ECG  NSR, rate 80, possible atrial enlargement.  ASSESSMENT AND PLAN  1. Midsternal Chest pain: 53 year old [place] American female who presented to the ED for further evaluation of constant  left sided chest and arm discomfort since last Wednesday. She first noticed the discomfort while at work and has also noticed a decrease in energy level and an increase in shortness of breath during her work outs.  Pain is not any worse with deep breathing, palpation, position changes, coughing, or activity.  She has no pertinent past medical history, is a non smoker, and occasionally drinks alcohol. Troponin level, CBC, CMP, and ECG are unremarkable. Blood pressure and heart rate are normal.  She has not had any recent prolonged travel, is a non-smoker, and is not using birth control.  We will check another troponin, repeat ECG, and check a d dimer (CTA if abnl) while here in ED.  Will treat with one dose of toradol.  If labs wnl, she can safely be discharged with MSK chest pain.  Signed, Murray Hodgkins, NP 12/28/2013, 4:16 PM   Patient seen and examined. Agree with assessment and plan. Very pleasant  53 yo AAF who is very active and exercised regularly. For the past 5 days she has noted a somewhat constant left upper pectoral discomfort and vague left arm ache. No exertional exacerbation. Pt has been doing pull up and chest work outs. No dyspnea. No palpitations, no exertional precipitation.  Suspect musculoskeletal etiology. Will check another troponin and ECG, check d-dimer. ECG is unremarkable Will give 30 mg iv toradol for pectoral tenderness to palpation. CXR wnl. Probable dc later today if f/u studies normal.   Troy Sine, MD, [facility] 12/28/2013 4:33 PM

## 2013-12-28 NOTE — ED Provider Notes (Addendum)
Patient seen by cardiology and felt that if she had a repeat normal EKG and a second troponin that was negative patient can be discharged home with musculoskeletal chest pain.  Second trop was neg.  Pain completely resolved after toradol.  Will d/c home.  Blanchie Dessert, MD 12/28/13 1805  Blanchie Dessert, MD 12/28/13 [phone]

## 2013-12-28 NOTE — H&P (Deleted)
Patient ID: Julie Mcdonald MRN: [id number], DOB/AGE: Jan 20, 1961   Admit date: 12/28/2013   Primary Physician: PROVIDER NOT IN SYSTEM Primary Cardiologist: new - seen by Corky Downs, MD   Pt. Profile:  Julie Mcdonald is a 53 year old female who is being evaluated today for chest pain and shortness of breath.  Problem List  Past Medical History  Diagnosis Date  . Seasonal allergies   . Wears glasses     Past Surgical History  Procedure Laterality Date  . Tubal ligation    . Breast surgery  1990    right breast  . Breast lumpectomy with needle localization  01/23/2012    Procedure: BREAST LUMPECTOMY WITH NEEDLE LOCALIZATION;  Surgeon: Merrie Roof, MD;  Location: [place];  Service: General;  Laterality: Right;     Allergies  No Known Allergies  HPI  Julie Mcdonald is a 53 year old African American female who is being seen today for left sided chest and arm discomfort since last Wednesday.  She has no pertinent past medical history and has not experienced this type of discomfort before. She is an active individual who works out multiple times weekly at [employer] and maintains a mostly healthy diet. Her mother has a history of atrial fibrillation with rapid ventricular response that was treated with an ablation; and hypertension.   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 She's had this discomfort constantly since Wednesday with a few worsening episodes two days ago and then again this morning. She is usually able to work out at the gym without difficulty but has felt unusually fatigued and short of breath during her most recent work outs. She feels she cannot get comfortable in general, and was unable to sleep two nights ago, and took 800mg  of ibuprofen along with zyrtec which helped to reduce the discomfort. Pain is present both during  exertion and at rest, and at its worst is 8/10. The pain does not dissipate completely.  Pain does not change with palpation, deep breathing, coughing, position changes, or activity.  Due to ongoing Ss, she called her PCP today and was advised to come to the ED.  She is currently in pain at this time (5/10) and has only received Aspirin.  ECG is non-acute and troponin is nl x 1.    Home Medications  Prior to Admission medications   Medication Sig Start Date End Date Taking? Authorizing Provider  cetirizine (ZYRTEC) 10 MG tablet Take 10 mg by mouth daily.   Yes Historical Provider, MD  Cholecalciferol (VITAMIN D PO) Take 1 tablet by mouth daily.   Yes Historical Provider, MD  ibuprofen (ADVIL,MOTRIN) 800 MG tablet Take 800 mg by mouth daily as needed.   Yes Historical Provider, MD  IRON PO Take 1 tablet by mouth every other day.   Yes Historical Provider, MD  Menthol-Methyl Salicylate (MUSCLE RUB) 10-15 % CREA Apply 1 application topically as needed for muscle pain.   Yes Historical Provider, MD  Multiple Vitamin (MULTIVITAMIN WITH MINERALS) TABS tablet Take 1 tablet by mouth daily.   Yes Historical Provider, MD  Omega-3 Fatty Acids (OMEGA 3 PO) Take 1 capsule by mouth daily.   Yes Historical Provider, MD  Probiotic Product (PROBIOTIC DAILY PO) Take 1 tablet by mouth daily.   Yes Historical Provider, MD  ranitidine (ZANTAC) 150 MG capsule Take 150 mg by mouth as needed.    Historical Provider, MD    Family History  Family History  Problem Relation Age of Onset  . Heart disease Mother   . Hypertension Mother   . Diabetes Maternal Grandmother   . Heart disease Maternal Grandmother   . Cancer Maternal Grandmother     colon  . Diabetes Maternal Grandfather   . Heart disease Maternal Grandfather   . Cancer Maternal Grandfather     Social History  History   Social History  . Marital Status: Married    Spouse Name: N/A    Number of Children: N/A  . Years of Education: N/A    Occupational History  . Not on file.   Social History Main Topics  . Smoking status: Never Smoker   . Smokeless tobacco: Never Used  . Alcohol Use: No  . Drug Use: No  . Sexual Activity: Yes    Birth Control/ Protection: Surgical   Other Topics Concern  . Not on file   Social History Narrative   Lives locally with husband.  Works as Theme park manager.  Very active and works out in gym with trainer several x/wk.     Review of Systems General:  No chills, fever, night sweats or weight changes.  Cardiovascular:  +++ Chest pain and dyspnea on exertion and at rest since last Wed.  Currently 5/10. No edema, orthopnea, palpitations, paroxysmal nocturnal dyspnea. Dermatological: No rash, lesions/masses Respiratory: No cough, dyspnea Urologic: No hematuria, dysuria Abdominal:   No nausea, vomiting, diarrhea, bright red blood per rectum, melena, or hematemesis Neurologic:  No visual changes, wkns, changes in mental status. All other systems reviewed and are otherwise negative except as noted above.  Physical Exam  Blood pressure 136/76, pulse 72, temperature 97.6 F (36.4 C), temperature source Oral, resp. rate 24, height 5\' 6"  (1.676 m), weight 147 lb (66.679 kg), SpO2 100 %.  General: Pleasant, NAD Psych: Normal affect. Neuro: Alert and oriented X 3. Moves all extremities spontaneously. HEENT: Normal  Neck: Supple without bruits or JVD. Lungs:  Resp regular and unlabored, CTA. Heart: RRR no s3, s4, or murmurs. Abdomen: Soft, non-tender, non-distended, BS + x 4.  Extremities: No clubbing, cyanosis or edema. DP/PT/Radials 2+ and equal bilaterally.  Labs  Troponin [facility] of Care Test)  Recent Labs  12/28/13 1130  TROPIPOC 0.01   Lab Results  Component Value Date   WBC 3.7* 12/28/2013   HGB 12.2 12/28/2013   HCT 38.8 12/28/2013   MCV 83.3 12/28/2013   PLT 226 12/28/2013     Recent Labs Lab 12/28/13 1121  NA 140  K 4.1  CL 101  CO2 26  BUN 11  CREATININE 0.70   CALCIUM 9.2  GLUCOSE 83    Radiology/Studies  Dg Chest 2 View  12/28/2013   CLINICAL DATA:  Left chest pain and shortness of breath for 2 days.  EXAM: CHEST  2 VIEW  COMPARISON:  May 11, 2008  FINDINGS: The heart size and mediastinal contours are within normal limits. There is no focal infiltrate, pulmonary edema, or pleural effusion. The visualized skeletal structures are unremarkable.  IMPRESSION: No active cardiopulmonary disease.   Electronically Signed   By: Abelardo Diesel M.D.   On: 12/28/2013 12:14   ECG  NSR, rate 80, possible atrial enlargement.  ASSESSMENT AND PLAN  1. Midsternal Chest pain: 53 year old [place] American female who presented to the ED for further evaluation of constant  left sided chest and arm discomfort since last Wednesday. She first noticed the discomfort while at work and has also noticed a decrease in energy level and an increase in shortness of breath during her work outs.  Pain is not any worse with deep breathing, palpation, position changes, coughing, or activity.  She has no pertinent past medical history, is a non smoker, and occasionally drinks alcohol. Troponin level, CBC, CMP, and ECG are unremarkable. Blood pressure and heart rate are normal.  She has not had any recent prolonged travel, is a non-smoker, and is not using birth control.  We will check another troponin, repeat ECG, and check a d dimer (CTA if abnl) while here in ED.  Will treat with one dose of toradol.  If labs wnl, she can safely be discharged with MSK chest pain.  Signed, Murray Hodgkins, NP 12/28/2013, 4:16 PM

## 2013-12-28 NOTE — Discharge Instructions (Signed)

## 2013-12-28 NOTE — ED Notes (Signed)
Spoke to Dr.Kohut concerning pt's plan of care; awaiting cardiology to see patient. Updated pt and family on plan of care

## 2013-12-28 NOTE — ED Notes (Signed)
Dr.Plunkett aware of discharge orders from cards. Pt denies chest pain at this time. Awaiting discharge papers

## 2014-06-04 ENCOUNTER — Encounter (HOSPITAL_COMMUNITY): Payer: Self-pay | Admitting: *Deleted

## 2014-06-04 ENCOUNTER — Emergency Department (HOSPITAL_COMMUNITY)
Admission: EM | Admit: 2014-06-04 | Discharge: 2014-06-04 | Disposition: A | Discharge status: Home / Self Care | Payer: BLUE CROSS/BLUE SHIELD | Attending: Emergency Medicine | Admitting: Emergency Medicine

## 2014-06-04 ENCOUNTER — Other Ambulatory Visit: Payer: Self-pay | Admitting: Internal Medicine

## 2014-06-04 ENCOUNTER — Ambulatory Visit
Admission: RE | Admit: 2014-06-04 | Discharge: 2014-06-04 | Disposition: A | Discharge status: Home / Self Care | Payer: Self-pay | Source: Ambulatory Visit | Attending: Internal Medicine | Admitting: Internal Medicine

## 2014-06-04 ENCOUNTER — Emergency Department (HOSPITAL_COMMUNITY): Payer: BLUE CROSS/BLUE SHIELD

## 2014-06-04 DIAGNOSIS — R1030 Lower abdominal pain, unspecified: Secondary | ICD-10-CM

## 2014-06-04 DIAGNOSIS — Z973 Presence of spectacles and contact lenses: Secondary | ICD-10-CM | POA: Diagnosis not present

## 2014-06-04 DIAGNOSIS — M549 Dorsalgia, unspecified: Secondary | ICD-10-CM | POA: Insufficient documentation

## 2014-06-04 DIAGNOSIS — R1010 Upper abdominal pain, unspecified: Secondary | ICD-10-CM | POA: Diagnosis present

## 2014-06-04 DIAGNOSIS — Z3202 Encounter for pregnancy test, result negative: Secondary | ICD-10-CM | POA: Insufficient documentation

## 2014-06-04 DIAGNOSIS — Z79899 Other long term (current) drug therapy: Secondary | ICD-10-CM | POA: Diagnosis not present

## 2014-06-04 LAB — CBC WITH DIFFERENTIAL/PLATELET
BASOS PCT: 1 % (ref 0–1)
Basophils Absolute: 0 10*3/uL (ref 0.0–0.1)
Eosinophils Absolute: 0.1 10*3/uL (ref 0.0–0.7)
Eosinophils Relative: 2 % (ref 0–5)
HCT: 34.3 % — ABNORMAL LOW (ref 36.0–46.0)
HEMOGLOBIN: 11.2 g/dL — AB (ref 12.0–15.0)
LYMPHS ABS: 2.3 10*3/uL (ref 0.7–4.0)
LYMPHS PCT: 46 % (ref 12–46)
MCH: 26.1 pg (ref 26.0–34.0)
MCHC: 32.7 g/dL (ref 30.0–36.0)
MCV: 80 fL (ref 78.0–100.0)
Monocytes Absolute: 0.2 10*3/uL (ref 0.1–1.0)
Monocytes Relative: 5 % (ref 3–12)
NEUTROS PCT: 46 % (ref 43–77)
Neutro Abs: 2.3 10*3/uL (ref 1.7–7.7)
Platelets: 219 10*3/uL (ref 150–400)
RBC: 4.29 MIL/uL (ref 3.87–5.11)
RDW: 13.3 % (ref 11.5–15.5)
WBC: 4.9 10*3/uL (ref 4.0–10.5)

## 2014-06-04 LAB — COMPREHENSIVE METABOLIC PANEL
ALT: 15 U/L (ref 0–35)
AST: 20 U/L (ref 0–37)
Albumin: 4.1 g/dL (ref 3.5–5.2)
Alkaline Phosphatase: 91 U/L (ref 39–117)
Anion gap: 10 (ref 5–15)
BUN: 11 mg/dL (ref 6–23)
CALCIUM: 9.5 mg/dL (ref 8.4–10.5)
CO2: 26 mmol/L (ref 19–32)
CREATININE: 0.66 mg/dL (ref 0.50–1.10)
Chloride: 105 mmol/L (ref 96–112)
GFR calc Af Amer: >90 mL/min (ref >90)
GFR calc non Af Amer: >90 mL/min (ref >90)
GLUCOSE: 92 mg/dL (ref 70–99)
Potassium: 3.6 mmol/L (ref 3.5–5.1)
SODIUM: 141 mmol/L (ref 135–145)
Total Bilirubin: 0.6 mg/dL (ref 0.3–1.2)
Total Protein: 7.5 g/dL (ref 6.0–8.3)

## 2014-06-04 LAB — URINALYSIS, ROUTINE W REFLEX MICROSCOPIC
BILIRUBIN URINE: NEGATIVE
GLUCOSE, UA: NEGATIVE mg/dL
Ketones, ur: NEGATIVE mg/dL
Nitrite: NEGATIVE
Protein, ur: NEGATIVE mg/dL
SPECIFIC GRAVITY, URINE: 1.004 — AB (ref 1.005–1.030)
Urobilinogen, UA: 0.2 mg/dL (ref 0.0–1.0)
pH: 7 (ref 5.0–8.0)

## 2014-06-04 LAB — I-STAT BETA HCG BLOOD, ED (MC, WL, AP ONLY): I-stat hCG, quantitative: <5 m[IU]/mL (ref <5)

## 2014-06-04 LAB — URINE MICROSCOPIC-ADD ON

## 2014-06-04 LAB — LIPASE, BLOOD: Lipase: 32 U/L (ref 11–59)

## 2014-06-04 MED ORDER — MORPHINE SULFATE 4 MG/ML IJ SOLN
4.0000 mg | Freq: Once | INTRAMUSCULAR | Status: AC
  Administered 2014-06-04: 4 mg via INTRAVENOUS
  Filled 2014-06-04: qty 1

## 2014-06-04 MED ORDER — SODIUM CHLORIDE 0.9 % IV SOLN
1000.0000 mL | INTRAVENOUS | Status: DC
  Administered 2014-06-04: 1000 mL via INTRAVENOUS

## 2014-06-04 MED ORDER — IOHEXOL 300 MG/ML  SOLN
25.0000 mL | Freq: Once | INTRAMUSCULAR | Status: AC | PRN
  Administered 2014-06-04: 25 mL via ORAL

## 2014-06-04 MED ORDER — OMEPRAZOLE 20 MG PO CPDR: 20.0000 mg | DELAYED_RELEASE_CAPSULE | Freq: Every day | ORAL | Status: DC

## 2014-06-04 MED ORDER — SODIUM CHLORIDE 0.9 % IV SOLN
1000.0000 mL | Freq: Once | INTRAVENOUS | Status: AC
Start: 2014-06-04 — End: 2014-06-04
  Administered 2014-06-04: 1000 mL via INTRAVENOUS

## 2014-06-04 MED ORDER — ONDANSETRON HCL 4 MG/2ML IJ SOLN
4.0000 mg | Freq: Once | INTRAMUSCULAR | Status: AC
  Administered 2014-06-04: 4 mg via INTRAVENOUS
  Filled 2014-06-04: qty 2

## 2014-06-04 MED ORDER — HYDROMORPHONE HCL 1 MG/ML IJ SOLN
0.5000 mg | INTRAMUSCULAR | Status: DC | PRN
  Administered 2014-06-04: 0.5 mg via INTRAVENOUS
  Filled 2014-06-04: qty 1

## 2014-06-04 MED ORDER — IOHEXOL 300 MG/ML  SOLN
100.0000 mL | Freq: Once | INTRAMUSCULAR | Status: AC | PRN
Start: 2014-06-04 — End: 2014-06-04
  Administered 2014-06-04: 100 mL via INTRAVENOUS

## 2014-06-04 NOTE — Discharge Instructions (Signed)

## 2014-06-04 NOTE — ED Provider Notes (Signed)
CSN: [id number]     Arrival date & time 06/04/14  1858 History   First MD Initiated Contact with Patient 06/04/14 1911     Chief Complaint  Patient presents with  . Abdominal Pain   HPI Patient complains of abdominal pain that's been in the left upper abdomen and also in her back. Symptoms started and she woke up on Tuesday morning.  The pain is constant and worsening. It does not seem be related to eating or drinking. She has not had any trouble with nausea vomiting fever. She has had some constipation. She feels like there is something moving on the inside and she feels like her left upper abdomen is swollen. She saw her primary doctor and was given medications for pain. She was prescribed Robaxin and tramadol. Patient's symptoms have not improved. She went to see her doctor's office today and was sent to the emergency room. Past Medical History  Diagnosis Date  . Seasonal allergies   . Wears glasses    Past Surgical History  Procedure Laterality Date  . Tubal ligation    . Breast surgery  1990    right breast  . Breast lumpectomy with needle localization  01/23/2012    Procedure: BREAST LUMPECTOMY WITH NEEDLE LOCALIZATION;  Surgeon: Merrie Roof, MD;  Location: [place];  Service: General;  Laterality: Right;   Family History  Problem Relation Age of Onset  . Heart disease Mother   . Hypertension Mother   . Diabetes Maternal Grandmother   . Heart disease Maternal Grandmother   . Cancer Maternal Grandmother     colon  . Diabetes Maternal Grandfather   . Heart disease Maternal Grandfather   . Cancer Maternal Grandfather    History  Substance Use Topics  . Smoking status: Never Smoker   . Smokeless tobacco: Never Used  . Alcohol Use: No   OB History    Gravida Para Term Preterm AB TAB SAB Ectopic Multiple Living   2    1   1  1      Review of Systems  Musculoskeletal: Positive for back pain.  All other systems reviewed and are  negative.     Allergies  Meloxicam  Home Medications   Prior to Admission medications   Medication Sig Start Date End Date Taking? Authorizing Provider  cetirizine (ZYRTEC) 10 MG tablet Take 10 mg by mouth daily.   Yes Historical Provider, MD  Misc Natural Products (GREEN TEA SLIM) TABS Take 2 tablets by mouth as needed (for working out).   Yes Historical Provider, MD  Multiple Vitamin (MULTIVITAMIN WITH MINERALS) TABS tablet Take 1 tablet by mouth daily.   Yes Historical Provider, MD  Multiple Vitamins-Minerals (HAIR/SKIN/NAILS) TABS Take 1 tablet by mouth every other day.   Yes Historical Provider, MD  Omega-3 Fatty Acids (OMEGA 3 PO) Take 1 capsule by mouth daily.   Yes Historical Provider, MD  Probiotic Product (PROBIOTIC DAILY PO) Take 1 tablet by mouth daily.   Yes Historical Provider, MD  Cholecalciferol (VITAMIN D PO) Take 1 tablet by mouth daily.    Historical Provider, MD  omeprazole (PRILOSEC) 20 MG capsule Take 1 capsule (20 mg total) by mouth daily. 06/04/14   Dorie Rank, MD   BP 167/72 mmHg  Pulse 84  Temp(Src) 97.9 F (36.6 C) (Oral)  Resp 16  Ht 5\' 6"  (1.676 m)  Wt 145 lb (65.772 kg)  BMI 23.41 kg/m2  SpO2 100% Physical Exam  Constitutional: She appears  well-developed and well-nourished. No distress.  HENT:  Head: Normocephalic and atraumatic.  Right Ear: External ear normal.  Left Ear: External ear normal.  Eyes: Conjunctivae are normal. Right eye exhibits no discharge. Left eye exhibits no discharge. No scleral icterus.  Neck: Neck supple. No tracheal deviation present.  Cardiovascular: Normal rate, regular rhythm and intact distal pulses.   Pulmonary/Chest: Effort normal and breath sounds normal. No stridor. No respiratory distress. She has no wheezes. She has no rales.  Abdominal: Soft. Bowel sounds are normal. She exhibits no distension and no mass. There is no tenderness. There is no rebound and no guarding.  No masses or hernia appreciated   Musculoskeletal: She exhibits no edema or tenderness.  Neurological: She is alert. She has normal strength. No cranial nerve deficit (no facial droop, extraocular movements intact, no slurred speech) or sensory deficit. She exhibits normal muscle tone. She displays no seizure activity. Coordination normal.  Skin: Skin is warm and dry. No rash noted.  Psychiatric: She has a normal mood and affect.  Nursing note and vitals reviewed.   ED Course  Procedures (including critical care time) Labs Review Labs Reviewed  CBC WITH DIFFERENTIAL/PLATELET - Abnormal; Notable for the following:    Hemoglobin 11.2 (*)    HCT 34.3 (*)    All other components within normal limits  URINALYSIS, ROUTINE W REFLEX MICROSCOPIC - Abnormal; Notable for the following:    Specific Gravity, Urine 1.004 (*)    Hgb urine dipstick TRACE (*)    Leukocytes, UA SMALL (*)    All other components within normal limits  COMPREHENSIVE METABOLIC PANEL  LIPASE, BLOOD  URINE MICROSCOPIC-ADD ON  I-STAT BETA HCG BLOOD, ED ([facility], [facility], [facility] ONLY)    Imaging Review Ct Abdomen Pelvis W Contrast  06/04/2014   CLINICAL DATA:  Acute onset of abdominal pain and bloating over the last 3 days.  EXAM: CT ABDOMEN AND PELVIS WITH CONTRAST  TECHNIQUE: Multidetector CT imaging of the abdomen and pelvis was performed using the standard protocol following bolus administration of intravenous contrast.  CONTRAST:  24mL OMNIPAQUE IOHEXOL 300 MG/ML SOLN, 16mL OMNIPAQUE IOHEXOL 300 MG/ML SOLN  COMPARISON:  Radiography same day  FINDINGS: Lung bases are clear. No pleural or pericardial fluid. The liver has normal appearance without focal lesions or biliary duct dilatation. No calcified gallstones. The spleen is normal. The pancreas is normal. The adrenal glands are normal. The kidneys are normal except for a few tiny cysts. The aorta shows mild atherosclerosis but no aneurysm. The IVC is normal. No retroperitoneal mass or lymphadenopathy. No bowel pathology  is seen. No sign of ileus, obstruction or free air. Uterus is unremarkable. No evidence of adnexal lesion. Tiny amount of free fluid in the pelvic cul-de-sac, not significant. No abnormal bony finding.  IMPRESSION: Negative study. No abnormality seen to explain pain or the sensation of bloating. No evidence of bowel obstruction or bowel lesion.   Electronically Signed   By: Nelson Chimes M.D.   On: 06/04/2014 22:08   Dg Abd 2 Views  06/04/2014   CLINICAL DATA:  Left lower quadrant pain  EXAM: ABDOMEN - 2 VIEW  COMPARISON:  09/25/2010  FINDINGS: Normal bowel gas pattern. Negative for bowel obstruction. Normal amount of stool in the colon. No air-fluid levels in no free air on the upright view. No renal calculi.  IMPRESSION: Negative.   Electronically Signed   By: Franchot Gallo M.D.   On: 06/04/2014 14:54     MDM  Final diagnoses:  Pain of upper abdomen    The CT scan does not show any acute abnormality to account for her symptoms.  Labs are reassuring as well.  Consider antacids for the bloating symptoms she has been having.  Follow up with her PCP.  Consider GI referral, endoscopy.    Dorie Rank, MD 06/04/14 [phone]

## 2014-06-04 NOTE — ED Notes (Addendum)
Pt reports waking up tues morning with lower back pain, LLQ pain and feeling distended to left side of abd. Denies n/v/d. Has been to pcp and prescribed robaxin and tramadol with no relief.

## 2014-06-08 ENCOUNTER — Ambulatory Visit
Admission: RE | Admit: 2014-06-08 | Discharge: 2014-06-08 | Disposition: A | Discharge status: Home / Self Care | Payer: BLUE CROSS/BLUE SHIELD | Source: Ambulatory Visit | Attending: Nurse Practitioner | Admitting: Nurse Practitioner

## 2014-06-08 ENCOUNTER — Other Ambulatory Visit: Payer: Self-pay | Admitting: Nurse Practitioner

## 2014-06-08 DIAGNOSIS — R52 Pain, unspecified: Secondary | ICD-10-CM

## 2014-06-09 ENCOUNTER — Other Ambulatory Visit: Payer: Self-pay | Admitting: Internal Medicine

## 2014-06-09 DIAGNOSIS — M549 Dorsalgia, unspecified: Secondary | ICD-10-CM

## 2014-06-11 ENCOUNTER — Other Ambulatory Visit: Payer: BLUE CROSS/BLUE SHIELD

## 2014-07-30 IMAGING — MG MM BREAST STEREO BIOPSY*R*
4 series · 4 of 4 positions shown · non-contrast
Comparison: Previous exams.

CLINICAL DATA: The patient is post left breast ultrasound guided
core biopsy.

DIGITAL DIAGNOSTIC LEFT BREAST MAMMOGRAM

[R CC (1 of 2)]
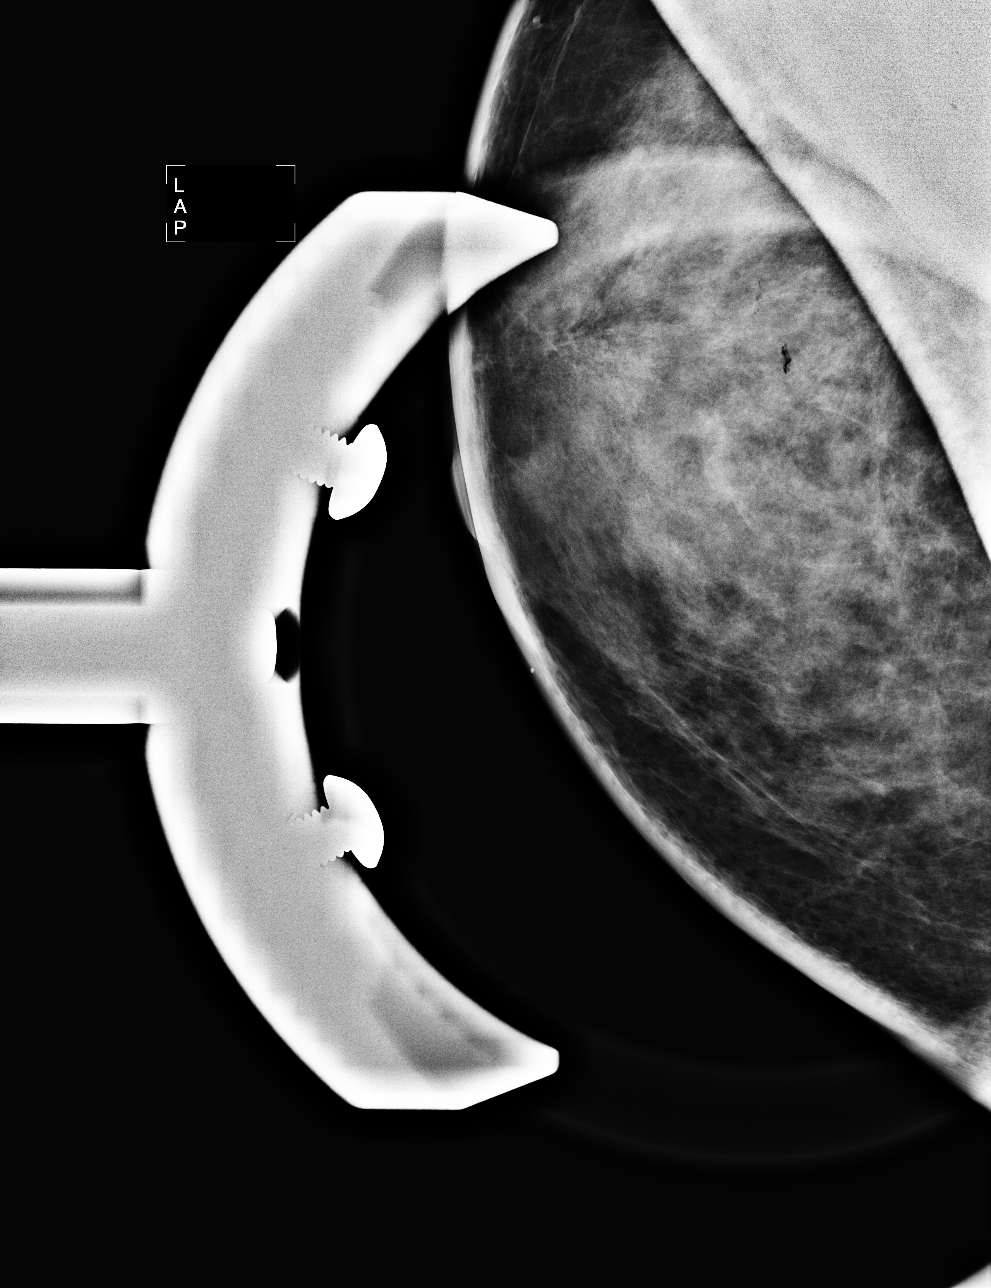

[R CC (2 of 2)]
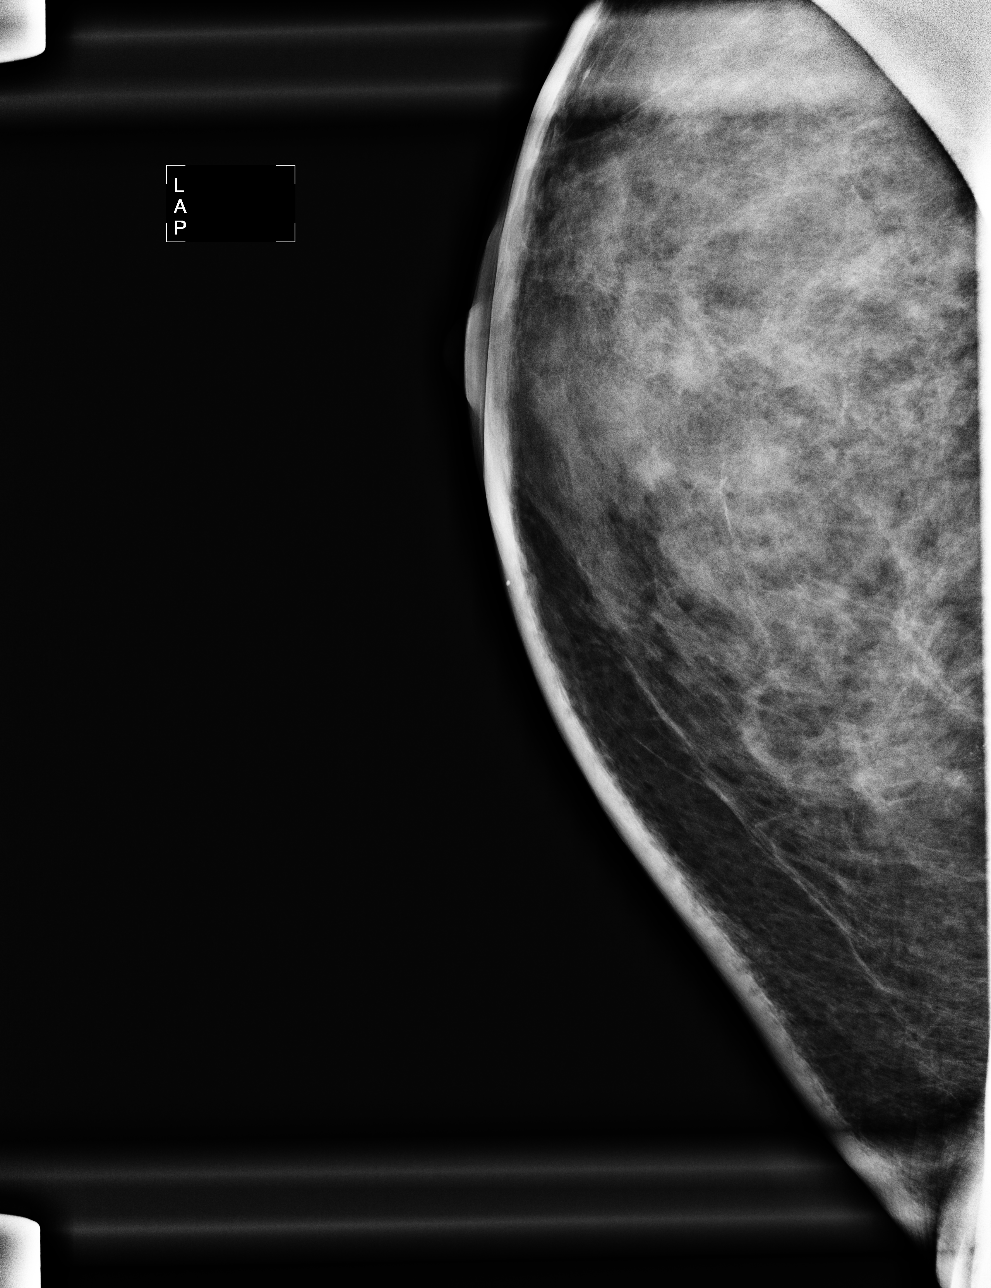

[L CC]
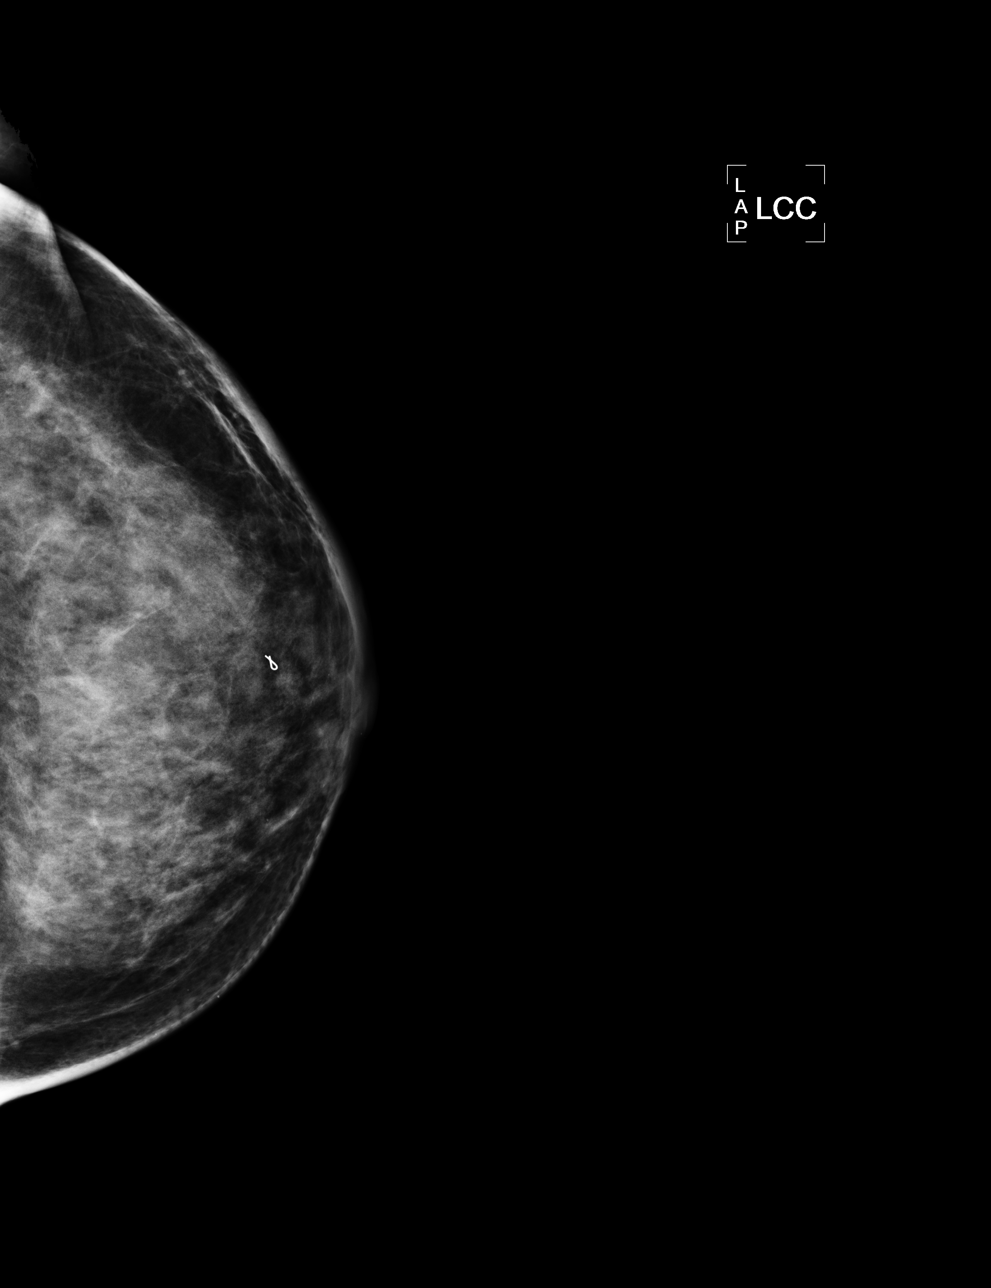

[L ML]
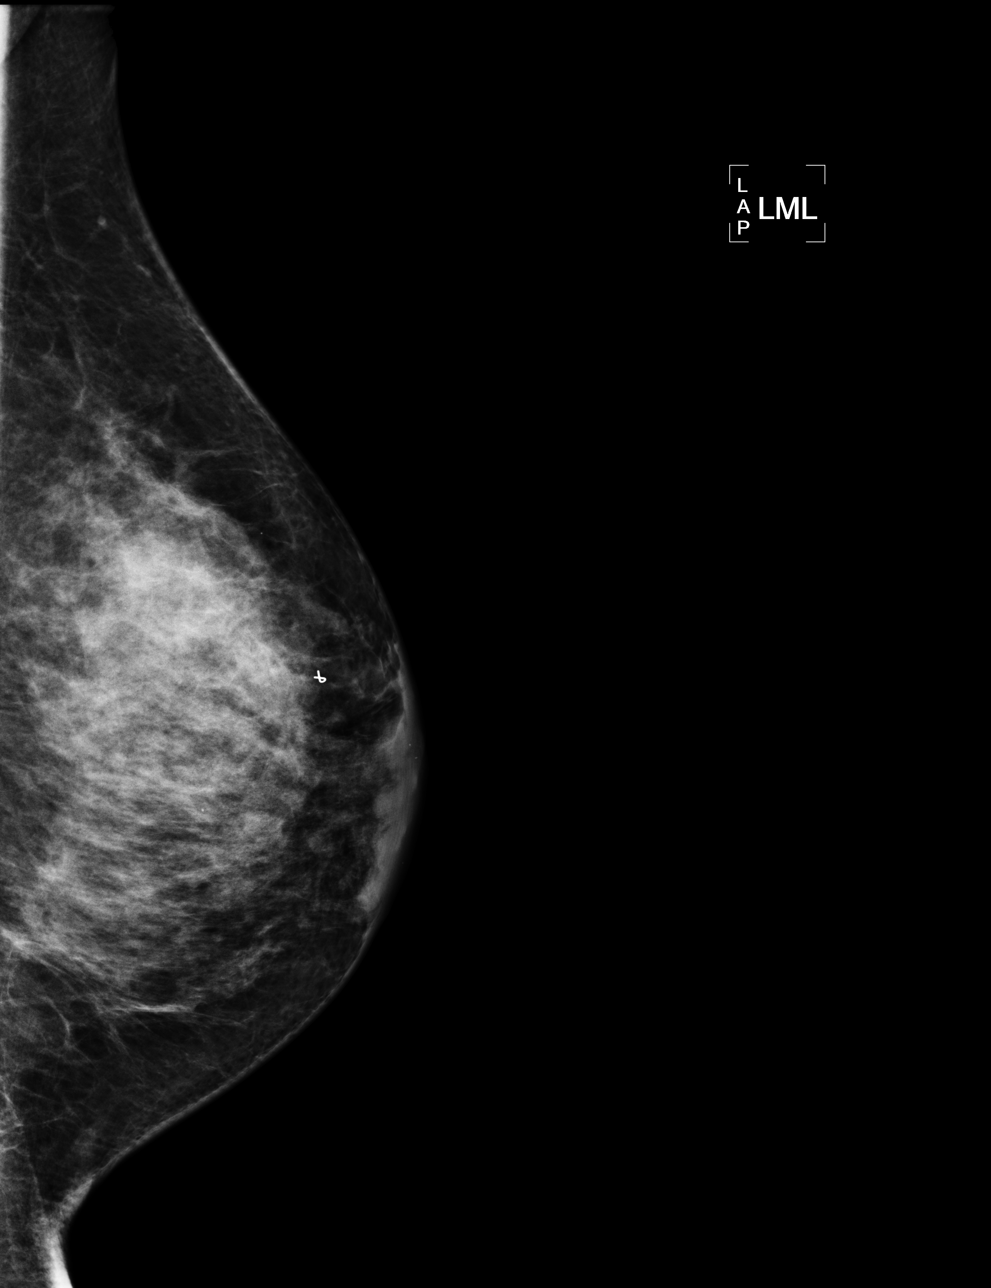

[4 of 4 positions shown; findings below may reference images not displayed]

FINDINGS: Films are performed following ultrasound guided biopsy
of the mass located within the superior periareolar portion of the
left breast at the 11-12 o'clock position.  The ribbon shaped clip
is in appropriate position.
IMPRESSION: Appropriate positioning of clip following left breast ultrasound
guided core biopsy.

Addendum:

The patient was also scheduled for a right breast stereotactic core
biopsy. A repeat right CC magnification view was obtained to better
visualize the tiny cluster of calcifications located medially
within the right breast.  The patient has been using a skin lotion
which makes her breasts "slippery" and, as a result, adequate
compression of the posterior right breast is difficult.  Attempts
to visualize the calcifications on repeat magnification views and
at the stereotactic biopsy table were not successful.

The patient has been asked to avoid using the lotion for one week
and repeat attempt to perform stereotactic core biopsy scheduled.

BI-RADS CATEGORY 4:  Suspicious abnormality - biopsy should be
considered.

## 2016-11-14 ENCOUNTER — Ambulatory Visit
Admission: RE | Admit: 2016-11-14 | Discharge: 2016-11-14 | Disposition: A | Discharge status: Home / Self Care | Payer: Self-pay | Source: Ambulatory Visit | Attending: Internal Medicine | Admitting: Internal Medicine

## 2016-11-14 ENCOUNTER — Other Ambulatory Visit: Payer: Self-pay | Admitting: Internal Medicine

## 2016-11-14 DIAGNOSIS — M79672 Pain in left foot: Secondary | ICD-10-CM

## 2016-12-14 ENCOUNTER — Encounter: Payer: BLUE CROSS/BLUE SHIELD | Admitting: Podiatry

## 2016-12-18 NOTE — Progress Notes (Signed)
No show

## 2017-03-23 ENCOUNTER — Encounter (HOSPITAL_COMMUNITY): Payer: Self-pay | Admitting: *Deleted

## 2017-03-23 ENCOUNTER — Other Ambulatory Visit: Payer: Self-pay

## 2017-03-23 ENCOUNTER — Ambulatory Visit (HOSPITAL_COMMUNITY)
Admission: EM | Admit: 2017-03-23 | Discharge: 2017-03-23 | Disposition: A | Discharge status: Home / Self Care | Payer: BLUE CROSS/BLUE SHIELD | Attending: Family Medicine | Admitting: Family Medicine

## 2017-03-23 DIAGNOSIS — R0981 Nasal congestion: Secondary | ICD-10-CM | POA: Insufficient documentation

## 2017-03-23 DIAGNOSIS — J111 Influenza due to unidentified influenza virus with other respiratory manifestations: Secondary | ICD-10-CM

## 2017-03-23 DIAGNOSIS — R69 Illness, unspecified: Secondary | ICD-10-CM | POA: Diagnosis not present

## 2017-03-23 DIAGNOSIS — R05 Cough: Secondary | ICD-10-CM | POA: Insufficient documentation

## 2017-03-23 DIAGNOSIS — R5383 Other fatigue: Secondary | ICD-10-CM | POA: Insufficient documentation

## 2017-03-23 DIAGNOSIS — Z8249 Family history of ischemic heart disease and other diseases of the circulatory system: Secondary | ICD-10-CM | POA: Insufficient documentation

## 2017-03-23 DIAGNOSIS — Z800 Family history of malignant neoplasm of digestive organs: Secondary | ICD-10-CM | POA: Diagnosis not present

## 2017-03-23 DIAGNOSIS — Z833 Family history of diabetes mellitus: Secondary | ICD-10-CM | POA: Insufficient documentation

## 2017-03-23 DIAGNOSIS — Z888 Allergy status to other drugs, medicaments and biological substances status: Secondary | ICD-10-CM | POA: Diagnosis not present

## 2017-03-23 LAB — POCT RAPID STREP A: Streptococcus, Group A Screen (Direct): NEGATIVE

## 2017-03-23 MED ORDER — OSELTAMIVIR PHOSPHATE 75 MG PO CAPS: 75.0000 mg | ORAL_CAPSULE | Freq: Two times a day (BID) | ORAL | 0 refills | Status: DC

## 2017-03-23 MED ORDER — HYDROCODONE-HOMATROPINE 5-1.5 MG/5ML PO SYRP: 5.0000 mL | ORAL_SOLUTION | Freq: Four times a day (QID) | ORAL | 0 refills | Status: DC | PRN

## 2017-03-23 NOTE — Discharge Instructions (Signed)

## 2017-03-23 NOTE — ED Triage Notes (Signed)
Body, chills, sore throat, cough, headaches,

## 2017-03-25 NOTE — ED Provider Notes (Signed)
[facility]   [id number] 03/23/17 Arrival Time: 1900  ASSESSMENT & PLAN:  1. Influenza-like illness     Meds ordered this encounter  Medications  . oseltamivir (TAMIFLU) 75 MG capsule    Sig: Take 1 capsule (75 mg total) by mouth every 12 (twelve) hours.    Dispense:  10 capsule    Refill:  0  . HYDROcodone-homatropine (HYCODAN) 5-1.5 MG/5ML syrup    Sig: Take 5 mLs by mouth every 6 (six) hours as needed for cough.    Dispense:  90 mL    Refill:  0   Cough medication sedation precautions. Discussed typical duration of symptoms. OTC symptom care as needed. Ensure adequate fluid intake and rest. May f/u with PCP or here as needed.  Reviewed expectations re: course of current medical issues. Questions answered. Outlined signs and symptoms indicating need for more acute intervention. Patient verbalized understanding. After Visit Summary given.   SUBJECTIVE: History from: patient.  Julie Mcdonald is a 57 y.o. female who presents with complaint of nasal congestion, post-nasal drainage, and a persistent dry cough. Onset abrupt, approximately 1-2 days ago. Overall fatigued with body aches. SOB: none. Wheezing: none. Fever: yes, subjective. Overall normal PO intake without n/v. Sick contacts: no. OTC treatment: none. Received flu shot this year: no.  Social History   Tobacco Use  Smoking Status Never Smoker  Smokeless Tobacco Never Used    ROS: As per HPI.   OBJECTIVE:  Vitals:   03/23/17 1957  BP: 136/64  Pulse: (!) 108  Temp: 99.8 F (37.7 C)  TempSrc: Oral  SpO2: 100%     General appearance: alert; appears fatigued HEENT: nasal congestion; clear runny nose; throat irritation secondary to post-nasal drainage Neck: supple without LAD Lungs: unlabored respirations, symmetrical air entry; cough: moderate; no respiratory distress Skin: warm and dry Psychological: alert and cooperative; normal mood and affect   Allergies  Allergen Reactions  .  Meloxicam Itching and Rash    Past Medical History:  Diagnosis Date  . Seasonal allergies   . Wears glasses    Family History  Problem Relation Age of Onset  . Heart disease Mother   . Hypertension Mother   . Diabetes Maternal Grandmother   . Heart disease Maternal Grandmother   . Cancer Maternal Grandmother        colon  . Diabetes Maternal Grandfather   . Heart disease Maternal Grandfather   . Cancer Maternal Grandfather    Social History   Socioeconomic History  . Marital status: Married    Spouse name: Not on file  . Number of children: Not on file  . Years of education: Not on file  . Highest education level: Not on file  Social Needs  . Financial resource strain: Not on file  . Food insecurity - worry: Not on file  . Food insecurity - inability: Not on file  . Transportation needs - medical: Not on file  . Transportation needs - non-medical: Not on file  Occupational History  . Not on file  Tobacco Use  . Smoking status: Never Smoker  . Smokeless tobacco: Never Used  Substance and Sexual Activity  . Alcohol use: No  . Drug use: No  . Sexual activity: Yes    Birth control/protection: Surgical  Other Topics Concern  . Not on file  Social History Narrative   Lives locally with husband.  Works as Theme park manager.  Very active and works out in gym with trainer several x/wk.  Vanessa Kick, MD 03/25/17 1005

## 2017-03-26 LAB — CULTURE, GROUP A STREP (THRC)

## 2017-03-27 ENCOUNTER — Ambulatory Visit
Admission: RE | Admit: 2017-03-27 | Discharge: 2017-03-27 | Disposition: A | Discharge status: Home / Self Care | Payer: BLUE CROSS/BLUE SHIELD | Source: Ambulatory Visit | Attending: Internal Medicine | Admitting: Internal Medicine

## 2017-03-27 ENCOUNTER — Other Ambulatory Visit: Payer: Self-pay | Admitting: Internal Medicine

## 2017-03-27 DIAGNOSIS — J40 Bronchitis, not specified as acute or chronic: Secondary | ICD-10-CM

## 2018-04-07 ENCOUNTER — Ambulatory Visit: Payer: BLUE CROSS/BLUE SHIELD | Admitting: Cardiology

## 2018-05-06 DIAGNOSIS — R030 Elevated blood-pressure reading, without diagnosis of hypertension: Secondary | ICD-10-CM | POA: Insufficient documentation

## 2018-05-06 DIAGNOSIS — I10 Essential (primary) hypertension: Secondary | ICD-10-CM | POA: Insufficient documentation

## 2018-06-09 DIAGNOSIS — D649 Anemia, unspecified: Secondary | ICD-10-CM | POA: Insufficient documentation

## 2019-11-24 ENCOUNTER — Other Ambulatory Visit: Payer: Self-pay | Admitting: *Deleted

## 2019-11-24 DIAGNOSIS — Z1231 Encounter for screening mammogram for malignant neoplasm of breast: Secondary | ICD-10-CM

## 2019-12-23 ENCOUNTER — Other Ambulatory Visit: Payer: Self-pay

## 2019-12-23 ENCOUNTER — Ambulatory Visit
Admission: RE | Admit: 2019-12-23 | Discharge: 2019-12-23 | Disposition: A | Discharge status: Home / Self Care | Payer: BLUE CROSS/BLUE SHIELD | Source: Ambulatory Visit | Attending: *Deleted | Admitting: *Deleted

## 2019-12-23 DIAGNOSIS — Z1231 Encounter for screening mammogram for malignant neoplasm of breast: Secondary | ICD-10-CM

## 2020-07-11 ENCOUNTER — Encounter: Payer: Self-pay | Admitting: Internal Medicine

## 2020-09-19 ENCOUNTER — Telehealth: Payer: Self-pay

## 2020-09-19 NOTE — Telephone Encounter (Signed)
LM with pt to call prior to 5pm today to r/s previsit appt that was missed today or I will cancel the colonoscopy and she will need to r/s both appts after 5 pm today.

## 2020-09-27 ENCOUNTER — Encounter: Payer: Self-pay | Admitting: Internal Medicine

## 2020-10-03 ENCOUNTER — Encounter: Payer: 59 | Admitting: Internal Medicine

## 2020-11-10 ENCOUNTER — Ambulatory Visit (AMBULATORY_SURGERY_CENTER): Payer: 59 | Admitting: *Deleted

## 2020-11-10 ENCOUNTER — Other Ambulatory Visit: Payer: Self-pay

## 2020-11-10 VITALS — Ht 66.0 in | Wt 153.0 lb

## 2020-11-10 DIAGNOSIS — Z1211 Encounter for screening for malignant neoplasm of colon: Secondary | ICD-10-CM

## 2020-11-10 MED ORDER — NA SULFATE-K SULFATE-MG SULF 17.5-3.13-1.6 GM/177ML PO SOLN: 1.0000 | Freq: Once | ORAL | 0 refills | Status: AC

## 2020-11-10 NOTE — Progress Notes (Signed)
Pt verified name, DOB, address and insurance during PV today.  Pt mailed instruction packet of Emmi video, copy of consent form to read and not return, and instructions.   PV completed over the phone.  Pt encouraged to call with questions or issues.  My Chart instructions to pt as well    No egg or soy allergy known to patient  No issues known to pt with past sedation with any surgeries or procedures Patient denies ever being told they had issues or difficulty with intubation  No FH of Malignant Hyperthermia Pt is not on diet pills Pt is not on  home 02  Pt is not on blood thinners  Pt denies issues with constipation  No A fib or A flutter  EMMI video to pt or via [place] 19 guidelines implemented in PV today with Pt and RN   Pt is fully vaccinated  for Covid   Due to the COVID-19 pandemic we are asking patients to follow certain guidelines.  Pt aware of COVID protocols and [facility] guidelines

## 2020-11-24 ENCOUNTER — Other Ambulatory Visit: Payer: Self-pay

## 2020-11-24 ENCOUNTER — Encounter: Payer: Self-pay | Admitting: Internal Medicine

## 2020-11-24 ENCOUNTER — Ambulatory Visit (AMBULATORY_SURGERY_CENTER): Payer: 59 | Admitting: Internal Medicine

## 2020-11-24 VITALS — BP 116/66 | HR 69 | Temp 97.3°F | Resp 13 | Ht 66.0 in | Wt 154.0 lb

## 2020-11-24 DIAGNOSIS — D125 Benign neoplasm of sigmoid colon: Secondary | ICD-10-CM

## 2020-11-24 DIAGNOSIS — Z1211 Encounter for screening for malignant neoplasm of colon: Secondary | ICD-10-CM | POA: Diagnosis present

## 2020-11-24 DIAGNOSIS — D124 Benign neoplasm of descending colon: Secondary | ICD-10-CM

## 2020-11-24 MED ORDER — SODIUM CHLORIDE 0.9 % IV SOLN: 500.0000 mL | Freq: Once | INTRAVENOUS | Status: DC

## 2020-11-24 NOTE — Progress Notes (Signed)
DT-VS Pt's states no medical or surgical changes since previsit or office visit.

## 2020-11-24 NOTE — Op Note (Signed)
[facility] Patient Name: Julie Mcdonald Procedure Date: 11/24/2020 11:58 AM MRN: [id number] Endoscopist: Gatha Mayer , MD Age: 60 Referring MD:  Date of Birth: 07-Oct-1960 Gender: Female Account #: [id number] Procedure:                Colonoscopy Indications:              Screening for colorectal malignant neoplasm Medicines:                Propofol per Anesthesia, Monitored Anesthesia Care Procedure:                Pre-Anesthesia Assessment:                           - Prior to the procedure, a History and Physical                            was performed, and patient medications and                            allergies were reviewed. The patient's tolerance of                            previous anesthesia was also reviewed. The risks                            and benefits of the procedure and the sedation                            options and risks were discussed with the patient.                            All questions were answered, and informed consent                            was obtained. Prior Anticoagulants: The patient has                            taken no previous anticoagulant or antiplatelet                            agents. ASA Grade Assessment: II - A patient with                            mild systemic disease. After reviewing the risks                            and benefits, the patient was deemed in                            satisfactory condition to undergo the procedure.                           After obtaining informed consent, the colonoscope  was passed under direct vision. Throughout the                            procedure, the patient's blood pressure, pulse, and                            oxygen saturations were monitored continuously. The                            PCF-HQ190L Colonoscope was introduced through the                            anus and advanced to the the cecum, identified by                             appendiceal orifice and ileocecal valve. The                            colonoscopy was performed without difficulty. The                            patient tolerated the procedure well. The quality                            of the bowel preparation was good. The bowel                            preparation used was SUPREP via split dose                            instruction. The ileocecal valve, appendiceal                            orifice, and rectum were photographed. Scope In: 12:10:14 PM Scope Out: 12:24:12 PM Scope Withdrawal Time: 0 hours 9 minutes 41 seconds  Total Procedure Duration: 0 hours 13 minutes 58 seconds  Findings:                 The perianal and digital rectal examinations were                            normal.                           Two sessile polyps were found in the sigmoid colon                            and descending colon. The polyps were 1 to 2 mm in                            size. These polyps were removed with a cold biopsy                            forceps. Resection and retrieval were complete.  The exam was otherwise without abnormality on                            direct and retroflexion views. Complications:            No immediate complications. Estimated Blood Loss:     Estimated blood loss was minimal. Impression:               - Two 1 to 2 mm polyps in the sigmoid colon and in                            the descending colon, removed with a cold biopsy                            forceps. Resected and retrieved.                           - The examination was otherwise normal on direct                            and retroflexion views. Recommendation:           - Patient has a contact number available for                            emergencies. The signs and symptoms of potential                            delayed complications were discussed with the                            patient. Return to normal  activities tomorrow.                            Written discharge instructions were provided to the                            patient.                           - Resume previous diet.                           - Continue present medications.                           - Repeat colonoscopy is recommended. The                            colonoscopy date will be determined after pathology                            results from today's exam become available for                            review. Gatha Mayer, MD 11/24/2020 12:32:08 PM  This report has been signed electronically.

## 2020-11-24 NOTE — Progress Notes (Signed)
Called to room to assist during endoscopic procedure.  Patient ID and intended procedure confirmed with present staff. Received instructions for my participation in the procedure from the performing physician.  

## 2020-11-24 NOTE — Progress Notes (Signed)
Report to PACU, RN, vss, BBS= Clear.  

## 2020-11-24 NOTE — Progress Notes (Signed)
[facility] History and Physical   Primary Care Physician:  Everardo Beals, NP   Reason for Procedure:   Colon cancer screening  Plan:    colonoscopy     HPI: Julie Mcdonald is a 60 y.o. female here for screening colonoscopy   Past Medical History:  Diagnosis Date   Allergy    Anemia    Anxiety    Arthritis    GERD (gastroesophageal reflux disease)    Palpitations    CTA 2020 normal no obstruction   Seasonal allergies    Wears glasses     Past Surgical History:  Procedure Laterality Date   BREAST LUMPECTOMY WITH NEEDLE LOCALIZATION  01/23/2012   Procedure: BREAST LUMPECTOMY WITH NEEDLE LOCALIZATION;  Surgeon: Merrie Roof, MD;  Location: [facility];  Service: General;  Laterality: Right;   BREAST SURGERY  02/20/1988   right breast removed cyst   COLONOSCOPY     many years ago   TUBAL LIGATION      Prior to Admission medications   Medication Sig Start Date End Date Taking? Authorizing Provider  cetirizine (ZYRTEC) 10 MG tablet Take 10 mg by mouth daily.   Yes [provider]  Cholecalciferol (VITAMIN D PO) Take 1 tablet by mouth daily.   Yes [provider]  Cyanocobalamin (VITAMIN B 12 PO) Take by mouth.   Yes [provider]  Multiple Vitamin (MULTIVITAMIN WITH MINERALS) TABS tablet Take 1 tablet by mouth daily.   Yes [provider]  Multiple Vitamins-Minerals (HAIR/SKIN/NAILS) TABS Take 1 tablet by mouth every other day.   Yes [provider]  naproxen (NAPROSYN) 500 MG tablet Take 500 mg by mouth 2 (two) times daily. 11/01/20  Yes [provider]  Probiotic Product (PROBIOTIC DAILY PO) Take 1 tablet by mouth daily.   Yes [provider]  ALPRAZolam Duanne Moron) 0.5 MG tablet Take 0.5 mg by mouth 2 (two) times daily as needed. 10/05/20   [provider]  ibuprofen (ADVIL) 800 MG tablet ibuprofen 800 mg tablet  TAKE 1 TABLET BY MOUTH TWICE A DAY IF NEEDED WITH FOOD  OR MILK    [provider]  traMADol (ULTRAM) 50 MG tablet Take 50 mg by mouth every 6 (six) hours as needed. 11/01/20   [provider]  venlafaxine XR (EFFEXOR-XR) 75 MG 24 hr capsule Take 75 mg by mouth daily. 11/01/20   [provider]    Current Outpatient Medications  Medication Sig Dispense Refill   cetirizine (ZYRTEC) 10 MG tablet Take 10 mg by mouth daily.     Cholecalciferol (VITAMIN D PO) Take 1 tablet by mouth daily.     Cyanocobalamin (VITAMIN B 12 PO) Take by mouth.     Multiple Vitamin (MULTIVITAMIN WITH MINERALS) TABS tablet Take 1 tablet by mouth daily.     Multiple Vitamins-Minerals (HAIR/SKIN/NAILS) TABS Take 1 tablet by mouth every other day.     naproxen (NAPROSYN) 500 MG tablet Take 500 mg by mouth 2 (two) times daily.     Probiotic Product (PROBIOTIC DAILY PO) Take 1 tablet by mouth daily.     ALPRAZolam (XANAX) 0.5 MG tablet Take 0.5 mg by mouth 2 (two) times daily as needed.     ibuprofen (ADVIL) 800 MG tablet ibuprofen 800 mg tablet  TAKE 1 TABLET BY MOUTH TWICE A DAY IF NEEDED WITH FOOD OR MILK     traMADol (ULTRAM) 50 MG tablet Take 50 mg by mouth every 6 (six) hours  as needed.     venlafaxine XR (EFFEXOR-XR) 75 MG 24 hr capsule Take 75 mg by mouth daily.     Current Facility-Administered Medications  Medication Dose Route Frequency Provider Last Rate Last Admin   0.9 %  sodium chloride infusion  500 mL Intravenous Once Gatha Mayer, MD        Allergies as of 11/24/2020 - Review Complete 11/24/2020  Allergen Reaction Noted   Meloxicam Itching and Rash 06/04/2014    Family History  Problem Relation Age of Onset   Heart disease Mother    Hypertension Mother    Breast cancer Maternal Aunt 70   Diabetes Maternal Grandmother    Heart disease Maternal Grandmother    Cancer Maternal Grandmother        colon   Diabetes Maternal Grandfather    Heart disease Maternal Grandfather    Cancer Maternal Grandfather    Colon cancer  Maternal Great-grandmother    Esophageal cancer Neg Hx    Colon polyps Neg Hx    Rectal cancer Neg Hx    Stomach cancer Neg Hx     Social History   Socioeconomic History   Marital status: Married                        Tobacco Use   Smoking status: Never   Smokeless tobacco: Never  Vaping Use   Vaping Use: Never used  Substance and Sexual Activity   Alcohol use: No   Drug use: No   Sexual activity: Yes    Birth control/protection: Surgical  Other Topics Concern   Not on file  Social History Narrative   Lives locally with husband.  Works as Theme park manager.  Very active and works out in gym with trainer several x/wk.      Review of Systems:  All other review of systems negative except as mentioned in the HPI.  Physical Exam: Vital signs BP (!) 154/80   Pulse 64   Temp (!) 97.3 F (36.3 C) (Temporal)   Resp 20   Ht 5\' 6"  (1.676 m)   Wt 154 lb (69.9 kg)   LMP 03/22/2012   SpO2 100%   BMI 24.86 kg/m   General:   Alert,  Well-developed, well-nourished, pleasant and cooperative in NAD Lungs:  Clear throughout to auscultation.   Heart:  Regular rate and rhythm; no murmurs, clicks, rubs,  or gallops. Abdomen:  Soft, nontender and nondistended. Normal bowel sounds.   Neuro/Psych:  Alert and cooperative. Normal mood and affect. A and O x 3   @Alithia Zavaleta  Simonne Maffucci, MD, [facility] [phone] (pager) 11/24/2020 12:04 PM@

## 2020-11-24 NOTE — Patient Instructions (Addendum)
I found and removed 2 tiny polyps. I will let you know pathology results and when to have another routine colonoscopy by mail and/or My Chart.  I appreciate the opportunity to care for you. Gatha Mayer, MD, FACG YOU HAD AN ENDOSCOPIC PROCEDURE TODAY AT [facility]:   Refer to the procedure report that was given to you for any specific questions about what was found during the examination.  If the procedure report does not answer your questions, please call your gastroenterologist to clarify.  If you requested that your care partner not be given the details of your procedure findings, then the procedure report has been included in a sealed envelope for you to review at your convenience later.  YOU SHOULD EXPECT: Some feelings of bloating in the abdomen. Passage of more gas than usual.  Walking can help get rid of the air that was put into your GI tract during the procedure and reduce the bloating. If you had a lower endoscopy (such as a colonoscopy or flexible sigmoidoscopy) you may notice spotting of blood in your stool or on the toilet paper. If you underwent a bowel prep for your procedure, you may not have a normal bowel movement for a few days.  Please Note:  You might notice some irritation and congestion in your nose or some drainage.  This is from the oxygen used during your procedure.  There is no need for concern and it should clear up in a day or so.  SYMPTOMS TO REPORT IMMEDIATELY:  Following lower endoscopy (colonoscopy or flexible sigmoidoscopy):  Excessive amounts of blood in the stool  Significant tenderness or worsening of abdominal pains  Swelling of the abdomen that is new, acute  Fever of 100F or higher  For urgent or emergent issues, a gastroenterologist can be reached at any hour by calling [phone]. Do not use MyChart messaging for urgent concerns.    DIET:  We do recommend a small meal at first, but then you may proceed to your regular diet.   Drink plenty of fluids but you should avoid alcoholic beverages for 24 hours.  MEDICATIONS: Continue present medications.  Please see handouts given to you by your recovery nurse.  Thank  you for allowing [place] to provide for your healthcare needs today.  ACTIVITY:  You should plan to take it easy for the rest of today and you should NOT DRIVE or use heavy machinery until tomorrow (because of the sedation medicines used during the test).    FOLLOW UP: Our staff will call the number listed on your records 48-72 hours following your procedure to check on you and address any questions or concerns that you may have regarding the information given to you following your procedure. If we do not reach you, we will leave a message.  We will attempt to reach you two times.  During this call, we will ask if you have developed any symptoms of COVID 19. If you develop any symptoms (ie: fever, flu-like symptoms, shortness of breath, cough etc.) before then, please call [phone].  If you test positive for Covid 19 in the 2 weeks post procedure, please call and report this information to [place].    If any biopsies were taken you will be contacted by phone or by letter within the next 1-3 weeks.  Please call us at [phone] if you have not heard about the biopsies in 3 weeks.    SIGNATURES/CONFIDENTIALITY: You and/or your care partner have  signed paperwork which will be entered into your electronic medical record.  These signatures attest to the fact that that the information above on your After Visit Summary has been reviewed and is understood.  Full responsibility of the confidentiality of this discharge information lies with you and/or your care-partner.

## 2020-11-28 ENCOUNTER — Telehealth: Payer: Self-pay | Admitting: *Deleted

## 2020-11-28 ENCOUNTER — Telehealth: Payer: Self-pay

## 2020-11-28 ENCOUNTER — Encounter: Payer: Self-pay | Admitting: Internal Medicine

## 2020-11-28 DIAGNOSIS — Z86010 Personal history of colonic polyps: Secondary | ICD-10-CM

## 2020-11-28 DIAGNOSIS — Z860101 Personal history of adenomatous and serrated colon polyps: Secondary | ICD-10-CM

## 2020-11-28 HISTORY — DX: Personal history of colonic polyps: Z86.010

## 2020-11-28 HISTORY — DX: Personal history of adenomatous and serrated colon polyps: Z86.0101

## 2020-11-28 NOTE — Telephone Encounter (Signed)
No answer, unable to leave a message, B.Carla Whilden RN. 

## 2020-11-28 NOTE — Telephone Encounter (Signed)
  Follow up Call-  Call back number 11/24/2020  Post procedure Call Back phone  # [phone]  Permission to leave phone message Yes  Some recent data might be hidden     Patient questions:  Mailbox is full.

## 2021-07-04 ENCOUNTER — Ambulatory Visit (HOSPITAL_COMMUNITY): Payer: Self-pay

## 2021-11-20 ENCOUNTER — Other Ambulatory Visit: Payer: Self-pay | Admitting: Internal Medicine

## 2021-11-20 DIAGNOSIS — Z1231 Encounter for screening mammogram for malignant neoplasm of breast: Secondary | ICD-10-CM

## 2021-12-01 ENCOUNTER — Other Ambulatory Visit: Payer: Self-pay

## 2021-12-01 ENCOUNTER — Emergency Department (HOSPITAL_COMMUNITY)
Admission: EM | Admit: 2021-12-01 | Discharge: 2021-12-01 | Disposition: A | Discharge status: Home / Self Care | Payer: Commercial Managed Care - HMO | Attending: Emergency Medicine | Admitting: Emergency Medicine

## 2021-12-01 ENCOUNTER — Encounter (HOSPITAL_COMMUNITY): Payer: Self-pay

## 2021-12-01 ENCOUNTER — Emergency Department (HOSPITAL_COMMUNITY): Payer: Commercial Managed Care - HMO

## 2021-12-01 DIAGNOSIS — M545 Low back pain, unspecified: Secondary | ICD-10-CM

## 2021-12-01 LAB — URINALYSIS, ROUTINE W REFLEX MICROSCOPIC
Bacteria, UA: NONE SEEN
Bilirubin Urine: NEGATIVE
Glucose, UA: NEGATIVE mg/dL
Hgb urine dipstick: NEGATIVE
Ketones, ur: NEGATIVE mg/dL
Leukocytes,Ua: NEGATIVE
Nitrite: NEGATIVE
Protein, ur: 30 mg/dL — AB
Specific Gravity, Urine: 1.027 (ref 1.005–1.030)
pH: 5 (ref 5.0–8.0)

## 2021-12-01 MED ORDER — LIDOCAINE 5 % EX PTCH
1.0000 | MEDICATED_PATCH | CUTANEOUS | Status: DC
  Administered 2021-12-01: 1 via TRANSDERMAL
  Filled 2021-12-01: qty 1

## 2021-12-01 MED ORDER — OXYCODONE-ACETAMINOPHEN 5-325 MG PO TABS: 1.0000 | ORAL_TABLET | Freq: Four times a day (QID) | ORAL | 0 refills | Status: DC | PRN

## 2021-12-01 MED ORDER — OXYCODONE-ACETAMINOPHEN 5-325 MG PO TABS
1.0000 | ORAL_TABLET | Freq: Once | ORAL | Status: AC
  Administered 2021-12-01: 1 via ORAL
  Filled 2021-12-01: qty 1

## 2021-12-01 MED ORDER — ACETAMINOPHEN 500 MG PO TABS
1000.0000 mg | ORAL_TABLET | Freq: Four times a day (QID) | ORAL | Status: DC | PRN
  Filled 2021-12-01: qty 2

## 2021-12-01 NOTE — ED Triage Notes (Signed)
Patient c/o left lower back pain that radiates into the left hip and left leg to the knee x 7 days,  Patient states she saw her PCP twice this week.

## 2021-12-01 NOTE — ED Provider Notes (Cosign Needed Addendum)
[facility] DEPT Provider Note   CSN: [id number] Arrival date & time: 12/01/21  3329     History  Chief Complaint  Patient presents with   Back Pain   HPI TIFFONY KITE is a 61 y.o. female with recent history of lower back pain.  States that it started Friday.  Pain is located in the left side of her lower back and at times radiates down to her her left hip and calf.  Was evaluated by PCP on Tuesday.  Treated pain and was administered steroid shot.  Was seen again on Thursday for the same complaint.  No intervention performed at that time.  Patient denies saddle anesthesia, trauma, fever, urinary or bowel changes and recent back surgery.  Denies hematuria.   Back Pain      Home Medications Prior to Admission medications   Medication Sig Start Date End Date Taking? Authorizing Provider  oxyCODONE-acetaminophen (PERCOCET/ROXICET) 5-325 MG tablet Take 1 tablet by mouth every 6 (six) hours as needed for severe pain. 12/01/21  Yes Harriet Pho, PA-C  ALPRAZolam Duanne Moron) 0.5 MG tablet Take 0.5 mg by mouth 2 (two) times daily as needed. 10/05/20   [provider]  cetirizine (ZYRTEC) 10 MG tablet Take 10 mg by mouth daily.    [provider]  Cholecalciferol (VITAMIN D PO) Take 1 tablet by mouth daily.    [provider]  Cyanocobalamin (VITAMIN B 12 PO) Take by mouth.    [provider]  ibuprofen (ADVIL) 800 MG tablet ibuprofen 800 mg tablet  TAKE 1 TABLET BY MOUTH TWICE A DAY IF NEEDED WITH FOOD OR MILK    [provider]  Multiple Vitamin (MULTIVITAMIN WITH MINERALS) TABS tablet Take 1 tablet by mouth daily.    [provider]  Multiple Vitamins-Minerals (HAIR/SKIN/NAILS) TABS Take 1 tablet by mouth every other day.    [provider]  naproxen (NAPROSYN) 500 MG tablet Take 500 mg by mouth 2 (two) times daily. 11/01/20   [provider]  Probiotic Product (PROBIOTIC DAILY PO)  Take 1 tablet by mouth daily.    [provider]  traMADol (ULTRAM) 50 MG tablet Take 50 mg by mouth every 6 (six) hours as needed. 11/01/20   [provider]  venlafaxine XR (EFFEXOR-XR) 75 MG 24 hr capsule Take 75 mg by mouth daily. 11/01/20   [provider]      Allergies    Meloxicam    Review of Systems   Review of Systems  Musculoskeletal:  Positive for back pain.    Physical Exam Updated Vital Signs BP (!) 144/69 (BP Location: Left Arm)   Pulse 86   Temp 98.6 F (37 C) (Oral)   Resp 18   Ht '5\' 6"'$  (1.676 m)   Wt 69.4 kg   LMP 03/22/2012   SpO2 100%   BMI 24.69 kg/m  Physical Exam Vitals and nursing note reviewed.  Constitutional:      Appearance: Normal appearance.  HENT:     Head: Normocephalic and atraumatic.     Nose: Nose normal.     Mouth/Throat:     Mouth: Mucous membranes are moist.  Eyes:     General:        Right eye: No discharge.        Left eye: No discharge.     Conjunctiva/sclera: Conjunctivae normal.  Cardiovascular:     Rate and Rhythm: Normal rate and regular rhythm.     Pulses:  Normal pulses.     Heart sounds: Normal heart sounds.  Pulmonary:     Effort: Pulmonary effort is normal.     Breath sounds: Normal breath sounds.  Abdominal:     General: Abdomen is flat.     Palpations: Abdomen is soft.  Musculoskeletal:     Comments: ROM in back normal with tenderness during active flexion  Skin:    General: Skin is warm and dry.  Neurological:     General: No focal deficit present.     Mental Status: She is alert.  Psychiatric:        Mood and Affect: Mood normal.     ED Results / Procedures / Treatments   Labs (all labs ordered are listed, but only abnormal results are displayed) Labs Reviewed  URINALYSIS, ROUTINE W REFLEX MICROSCOPIC - Abnormal; Notable for the following components:      Result Value   Protein, ur 30 (*)    All other components within normal limits    EKG None  Radiology DG  Lumbar Spine Complete  Result Date: 12/01/2021 CLINICAL DATA:  Left lower back pain radiating to left hip and left leg for 7 days. EXAM: LUMBAR SPINE - COMPLETE 4+ VIEW COMPARISON:  Lumbar spine radiographs 09/25/2010 FINDINGS: There are 5 non-rib-bearing lumbar-type vertebral bodies. Normal frontal alignment. No sagittal spondylolisthesis. Vertebral body heights are maintained. Mild L1-2 and moderate to severe L5-S1 disc space narrowing, worsened from 09/25/2010. L5-S1 moderate to severe facet joint arthropathy. Mild anterior L1-2 through L3-4 endplate osteophytes. IMPRESSION: Mild L1-2 and moderate to severe L5-S1 degenerative disc and facet joint changes, worsened from 09/25/2010 remote prior. Electronically Signed   By: Yvonne Kendall M.D.   On: 12/01/2021 10:52    Procedures Procedures    Medications Ordered in ED Medications  lidocaine (LIDODERM) 5 % 1 patch (1 patch Transdermal Patch Applied 12/01/21 1103)  oxyCODONE-acetaminophen (PERCOCET/ROXICET) 5-325 MG per tablet 1 tablet (has no administration in time range)    ED Course/ Medical Decision Making/ A&P Clinical Course as of 12/01/21 1111  Fri Dec 01, 2021  1108 Squamous Epithelial / LPF: 0-5 [JR]    Clinical Course User Index [JR] Harriet Pho, PA-C                           Medical Decision Making Amount and/or Complexity of Data Reviewed Labs: ordered. Decision-making details documented in ED Course. Radiology: ordered.  Risk OTC drugs. Prescription drug management.   Patient presented for lower back pain. Differential diagnosis includes cauda equina syndrome, pyelonephritis, renal stone, appendicitis.  Doubt cauda equina syndrome given no red flag symptoms. Doubt pyelonephritis given no hematuria or flank pain and no fever and unremarkable UA.  Doubt acute cystitis given no urinary changes and unremarkable UA. Doubt appendicitis given no focal abdominal pain. Symptoms are likely related to MSK injury.  Treated pain  with Percocet.  Advised patient to follow-up with PCP for ongoing lower back pain.     Final Clinical Impression(s) / ED Diagnoses Final diagnoses:  Low back pain, unspecified back pain laterality, unspecified chronicity, unspecified whether sciatica present    Rx / DC Orders ED Discharge Orders          Ordered    oxyCODONE-acetaminophen (PERCOCET/ROXICET) 5-325 MG tablet  Every 6 hours PRN        12/01/21 1110              Harriet Pho, PA-C  12/01/21 [place], [place], PA-C 12/01/21 [place], DO 12/02/21 2030

## 2021-12-01 NOTE — Discharge Instructions (Addendum)
Evaluation for your Lower back pain is overall reassuring.  X-ray did not reveal moderate to severe degenerative changes from L5-S1.  This is likely the source of your pain.  Recommend that you follow-up with your PCP regarding ongoing lower back pain that will likely be chronic in nature.  Recommend physical therapy, ibuprofen for pain, also can take Percocet as needed for pain but would not recommend taking it with your Xanax.  If you have new saddle numbness, urinary or bowel incontinence, new fever return to the emergency department for further evaluation.

## 2021-12-03 ENCOUNTER — Emergency Department (HOSPITAL_COMMUNITY)
Admission: EM | Admit: 2021-12-03 | Discharge: 2021-12-04 | Disposition: A | Discharge status: Home / Self Care | Payer: Commercial Managed Care - HMO | Attending: Emergency Medicine | Admitting: Emergency Medicine

## 2021-12-03 ENCOUNTER — Encounter (HOSPITAL_COMMUNITY): Payer: Self-pay

## 2021-12-03 ENCOUNTER — Other Ambulatory Visit: Payer: Self-pay

## 2021-12-03 DIAGNOSIS — M5432 Sciatica, left side: Secondary | ICD-10-CM

## 2021-12-03 DIAGNOSIS — M5442 Lumbago with sciatica, left side: Secondary | ICD-10-CM | POA: Insufficient documentation

## 2021-12-03 DIAGNOSIS — R269 Unspecified abnormalities of gait and mobility: Secondary | ICD-10-CM | POA: Insufficient documentation

## 2021-12-03 MED ORDER — OXYCODONE-ACETAMINOPHEN 5-325 MG PO TABS
1.0000 | ORAL_TABLET | Freq: Once | ORAL | Status: AC
  Administered 2021-12-03: 1 via ORAL
  Filled 2021-12-03: qty 1

## 2021-12-03 MED ORDER — METHOCARBAMOL 500 MG PO TABS
750.0000 mg | ORAL_TABLET | Freq: Once | ORAL | Status: AC
  Administered 2021-12-03: 750 mg via ORAL
  Filled 2021-12-03: qty 2

## 2021-12-03 NOTE — ED Triage Notes (Signed)
Pt reports with left lower back pain that radiates down her left hip and left leg. Pt states that the pain is not controlled with the pain medications that were given.

## 2021-12-03 NOTE — ED Provider Triage Note (Signed)
Emergency Medicine Provider Triage Evaluation Note  Julie Mcdonald , a 61 y.o. female  was evaluated in triage.  Patient has been seen for this recently the Percocet is not working.  No red flag symptoms today.  Reports that she is having difficulty walking, which is visualized on physical exam however patient was able to ambulate to the triage room 1+ assist  Review of Systems  Positive:  Negative:   Physical Exam  BP (!) 161/88 (BP Location: Left Arm)   Pulse (!) 101   Temp 98.2 F (36.8 C) (Oral)   Resp (!) 22   LMP 03/22/2012   SpO2 98%  Gen:   Awake, no distress   Resp:  Normal effort  MSK:   Moves extremities without difficulty  Other:  Rolling around on the chair.  Very apparently uncomfortable.  Medical Decision Making  Medically screening exam initiated at 11:57 PM.  Appropriate orders placed.  RICHELE STRAND was informed that the remainder of the evaluation will be completed by another provider, this initial triage assessment does not replace that evaluation, and the importance of remaining in the ED until their evaluation is complete.    I explained to the patient and her husband that this is their triage area they do not have any space in the back but I will do my best to control her pain.  The most I can offer from triage is a Percocet and Robaxin.  Patient would like these medications.  Patient's husband is wondering what imaging will be done if she is going to sit out in the waiting room.  MRI decision will be deferred to the back of the department after more thorough evaluation.  I am agreeable to ordering a Noncon CT scan so that there is some imaging done as patient will likely wait many hours in the waiting room.   Adelaine Roppolo, [place], PA-C 12/04/21 0001

## 2021-12-04 ENCOUNTER — Encounter: Payer: Self-pay | Admitting: Neurology

## 2021-12-04 ENCOUNTER — Emergency Department (HOSPITAL_COMMUNITY): Payer: Commercial Managed Care - HMO

## 2021-12-04 LAB — CBC WITH DIFFERENTIAL/PLATELET
Abs Immature Granulocytes: 0.04 10*3/uL (ref 0.00–0.07)
Basophils Absolute: 0 10*3/uL (ref 0.0–0.1)
Basophils Relative: 0 %
Eosinophils Absolute: 0 10*3/uL (ref 0.0–0.5)
Eosinophils Relative: 0 %
HCT: 37.8 % (ref 36.0–46.0)
Hemoglobin: 11.9 g/dL — ABNORMAL LOW (ref 12.0–15.0)
Immature Granulocytes: 1 %
Lymphocytes Relative: 28 %
Lymphs Abs: 1.9 10*3/uL (ref 0.7–4.0)
MCH: 26.4 pg (ref 26.0–34.0)
MCHC: 31.5 g/dL (ref 30.0–36.0)
MCV: 84 fL (ref 80.0–100.0)
Monocytes Absolute: 0.5 10*3/uL (ref 0.1–1.0)
Monocytes Relative: 7 %
Neutro Abs: 4.5 10*3/uL (ref 1.7–7.7)
Neutrophils Relative %: 64 %
Platelets: 249 10*3/uL (ref 150–400)
RBC: 4.5 MIL/uL (ref 3.87–5.11)
RDW: 13.5 % (ref 11.5–15.5)
WBC: 6.9 10*3/uL (ref 4.0–10.5)
nRBC: 0 % (ref 0.0–0.2)

## 2021-12-04 LAB — BASIC METABOLIC PANEL
Anion gap: 9 (ref 5–15)
BUN: 11 mg/dL (ref 6–20)
CO2: 27 mmol/L (ref 22–32)
Calcium: 9.4 mg/dL (ref 8.9–10.3)
Chloride: 102 mmol/L (ref 98–111)
Creatinine, Ser: 0.62 mg/dL (ref 0.44–1.00)
GFR, Estimated: >60 mL/min (ref >60)
Glucose, Bld: 126 mg/dL — ABNORMAL HIGH (ref 70–99)
Potassium: 3.5 mmol/L (ref 3.5–5.1)
Sodium: 138 mmol/L (ref 135–145)

## 2021-12-04 MED ORDER — METHOCARBAMOL 500 MG PO TABS: 500.0000 mg | ORAL_TABLET | Freq: Two times a day (BID) | ORAL | 0 refills | Status: DC

## 2021-12-04 MED ORDER — KETOROLAC TROMETHAMINE 30 MG/ML IJ SOLN
30.0000 mg | Freq: Once | INTRAMUSCULAR | Status: AC
  Administered 2021-12-04: 30 mg via INTRAMUSCULAR
  Filled 2021-12-04: qty 1

## 2021-12-04 NOTE — ED Provider Notes (Signed)
[facility] DEPT  Provider Note  CSN: [id number] Arrival date & time: 12/03/21 2304  History Chief Complaint  Patient presents with   Back Pain    Julie Mcdonald is a 61 y.o. female with history of low back pain has had increased pain for the last 10 days, radiating from left lower back down her L leg. No numbness or weakness. She has seen her PCP x 2 and ED visit on 10/13 for same. She has had steroid injections, tramadol, meloxicam, oral steroids (currently still taking) and oxycodone with no change in her symptoms. She has never seen neurosurgery. She denies any fever or problems with bowel or bladder (other than opiate induced constipation).    Home Medications Prior to Admission medications   Medication Sig Start Date End Date Taking? Authorizing Provider  methocarbamol (ROBAXIN) 500 MG tablet Take 1 tablet (500 mg total) by mouth 2 (two) times daily. 12/04/21  Yes Truddie Hidden, MD  ALPRAZolam Duanne Moron) 0.5 MG tablet Take 0.5 mg by mouth 2 (two) times daily as needed. 10/05/20   [provider]  cetirizine (ZYRTEC) 10 MG tablet Take 10 mg by mouth daily.    [provider]  Cholecalciferol (VITAMIN D PO) Take 1 tablet by mouth daily.    [provider]  Cyanocobalamin (VITAMIN B 12 PO) Take by mouth.    [provider]  ibuprofen (ADVIL) 800 MG tablet ibuprofen 800 mg tablet  TAKE 1 TABLET BY MOUTH TWICE A DAY IF NEEDED WITH FOOD OR MILK    [provider]  Multiple Vitamin (MULTIVITAMIN WITH MINERALS) TABS tablet Take 1 tablet by mouth daily.    [provider]  Multiple Vitamins-Minerals (HAIR/SKIN/NAILS) TABS Take 1 tablet by mouth every other day.    [provider]  naproxen (NAPROSYN) 500 MG tablet Take 500 mg by mouth 2 (two) times daily. 11/01/20   [provider]  oxyCODONE-acetaminophen (PERCOCET/ROXICET) 5-325 MG tablet Take 1 tablet by mouth every 6 (six) hours as  needed for severe pain. 12/01/21   Harriet Pho, PA-C  Probiotic Product (PROBIOTIC DAILY PO) Take 1 tablet by mouth daily.    [provider]  traMADol (ULTRAM) 50 MG tablet Take 50 mg by mouth every 6 (six) hours as needed. 11/01/20   [provider]  venlafaxine XR (EFFEXOR-XR) 75 MG 24 hr capsule Take 75 mg by mouth daily. 11/01/20   [provider]     Allergies    Meloxicam   Review of Systems   Review of Systems Please see HPI for pertinent positives and negatives  Physical Exam BP (!) 166/79 (BP Location: Right Arm)   Pulse 72   Temp 98.4 F (36.9 C) (Oral)   Resp 20   LMP 03/22/2012   SpO2 100%   Physical Exam Vitals and nursing note reviewed.  Constitutional:      Appearance: Normal appearance.  HENT:     Head: Normocephalic and atraumatic.     Nose: Nose normal.     Mouth/Throat:     Mouth: Mucous membranes are moist.  Eyes:     Extraocular Movements: Extraocular movements intact.     Conjunctiva/sclera: Conjunctivae normal.  Cardiovascular:     Rate and Rhythm: Normal rate.  Pulmonary:     Effort: Pulmonary effort is normal.     Breath sounds: Normal breath sounds.  Abdominal:     General: Abdomen is flat.     Palpations: Abdomen is soft.  Tenderness: There is no abdominal tenderness.  Musculoskeletal:        General: Tenderness (L lumbar paraspinal muscles) present. No swelling. Normal range of motion.     Cervical back: Neck supple.  Skin:    General: Skin is warm and dry.  Neurological:     General: No focal deficit present.     Mental Status: She is alert.     Motor: No weakness.     Coordination: Coordination normal.     Gait: Gait abnormal (antalgic).     Deep Tendon Reflexes: Reflexes normal.  Psychiatric:        Mood and Affect: Mood normal.     ED Results / Procedures / Treatments   EKG None  Procedures Procedures  Medications Ordered in the ED Medications  ketorolac (TORADOL) 30 MG/ML  injection 30 mg (has no administration in time range)  oxyCODONE-acetaminophen (PERCOCET/ROXICET) 5-325 MG per tablet 1 tablet (1 tablet Oral Given 12/03/21 2359)  methocarbamol (ROBAXIN) tablet 750 mg (750 mg Oral Given 12/03/21 2359)    Initial Impression and Plan  Patient here with persistent low back pain. No red flags, has already tried several treatment modalities without improvement. Given oxycodone and robaxin in the waiting room without change. Had labs done showing normal CBC and BMP, I personally viewed the images from radiology studies and agree with radiologist interpretation: CT of lumbar spine was neg for fracture/subluxation, there is mild DDD at L5/S1. Patient advised given all the things she has tried so far, there is not much else we have to offer her in the short term to control her symptoms aside from pain medications in the ED. Recommend she follow up with Neurosurgery for further evaluation, may need ESI, MRI, or even surgery but no indication for emergent MRI or surgery at this time.    ED Course       MDM Rules/Calculators/A&P Medical Decision Making Problems Addressed: Sciatica of left side: acute illness or injury  Amount and/or Complexity of Data Reviewed Labs: ordered. Decision-making details documented in ED Course. Radiology: ordered and independent interpretation performed. Decision-making details documented in ED Course.  Risk Prescription drug management.    Final Clinical Impression(s) / ED Diagnoses Final diagnoses:  Sciatica of left side    Rx / DC Orders ED Discharge Orders          Ordered    methocarbamol (ROBAXIN) 500 MG tablet  2 times daily        12/04/21 0447             Truddie Hidden, MD 12/04/21 [phone]

## 2021-12-06 ENCOUNTER — Encounter (HOSPITAL_COMMUNITY): Payer: Self-pay | Admitting: Emergency Medicine

## 2021-12-06 ENCOUNTER — Emergency Department (HOSPITAL_COMMUNITY)
Admission: EM | Admit: 2021-12-06 | Discharge: 2021-12-07 | Disposition: A | Discharge status: Home / Self Care | Payer: Commercial Managed Care - HMO | Attending: Emergency Medicine | Admitting: Emergency Medicine

## 2021-12-06 ENCOUNTER — Other Ambulatory Visit: Payer: Self-pay

## 2021-12-06 DIAGNOSIS — R202 Paresthesia of skin: Secondary | ICD-10-CM | POA: Diagnosis not present

## 2021-12-06 DIAGNOSIS — M545 Low back pain, unspecified: Secondary | ICD-10-CM | POA: Insufficient documentation

## 2021-12-06 DIAGNOSIS — M5416 Radiculopathy, lumbar region: Secondary | ICD-10-CM

## 2021-12-06 NOTE — ED Triage Notes (Signed)
Pt here for c/o L leg numbness that extends from her groin to her thigh to her lower leg. States she can still feel her toes. Denies bowel or bladder incontinence. Was recently seen at [facility] twice ( Fri and Sunday) for sciatica but has progressively gotten worse. States she has had multiple Xrays including CT scan and has been referred to a Neurologist but hasn't gotten an appointment yet. States she called MD today with symptoms currently experiencing and was told to come to ED.

## 2021-12-07 ENCOUNTER — Ambulatory Visit: Payer: Commercial Managed Care - HMO

## 2021-12-07 MED ORDER — PREDNISONE 50 MG PO TABS
60.0000 mg | ORAL_TABLET | Freq: Once | ORAL | Status: AC
  Administered 2021-12-07: 60 mg via ORAL
  Filled 2021-12-07: qty 1

## 2021-12-07 MED ORDER — HYDROMORPHONE HCL 1 MG/ML IJ SOLN
2.0000 mg | Freq: Once | INTRAMUSCULAR | Status: AC
  Administered 2021-12-07: 2 mg via INTRAMUSCULAR
  Filled 2021-12-07: qty 2

## 2021-12-07 MED ORDER — OXYCODONE-ACETAMINOPHEN 5-325 MG PO TABS: 1.0000 | ORAL_TABLET | Freq: Four times a day (QID) | ORAL | 0 refills | Status: DC | PRN

## 2021-12-07 MED ORDER — PREDNISONE 20 MG PO TABS: ORAL_TABLET | ORAL | 0 refills | Status: DC

## 2021-12-07 MED ORDER — KETOROLAC TROMETHAMINE 60 MG/2ML IM SOLN
60.0000 mg | Freq: Once | INTRAMUSCULAR | Status: AC
  Administered 2021-12-07: 60 mg via INTRAMUSCULAR
  Filled 2021-12-07: qty 2

## 2021-12-07 NOTE — Discharge Instructions (Signed)
Begin taking prednisone as prescribed.  Begin taking Percocet as prescribed as needed for pain.  MRI as to be scheduled, then follow-up with neurosurgery afterward.

## 2021-12-07 NOTE — ED Provider Notes (Addendum)
[facility] EMERGENCY DEPARTMENT Provider Note   CSN: [id number] Arrival date & time: 12/06/21  2006     History  Chief Complaint  Patient presents with   Leg Numbness    Julie Mcdonald is a 61 y.o. female.  Patient is a 61 year old female with past medical history of sciatica.  Patient presenting to the ER for the third time in the past 5 days with complaints of back pain and leg numbness.  She has also been seen by her primary doctor for the same.  She has been treated with meloxicam, prednisone, Percocet, gabapentin, however her pain is worsening.  She is now experiencing numbness to her left leg from the groin down to her foot.  She denies to me she is having any bowel or bladder incontinence.  Symptoms are worse when she attempts to ambulate.  She is getting little relief with the above listed medications.  The history is provided by the patient.       Home Medications Prior to Admission medications   Medication Sig Start Date End Date Taking? Authorizing Provider  ALPRAZolam Duanne Moron) 0.5 MG tablet Take 0.5 mg by mouth 2 (two) times daily as needed. 10/05/20   [provider]  cetirizine (ZYRTEC) 10 MG tablet Take 10 mg by mouth daily.    [provider]  Cholecalciferol (VITAMIN D PO) Take 1 tablet by mouth daily.    [provider]  Cyanocobalamin (VITAMIN B 12 PO) Take by mouth.    [provider]  ibuprofen (ADVIL) 800 MG tablet ibuprofen 800 mg tablet  TAKE 1 TABLET BY MOUTH TWICE A DAY IF NEEDED WITH FOOD OR MILK    [provider]  methocarbamol (ROBAXIN) 500 MG tablet Take 1 tablet (500 mg total) by mouth 2 (two) times daily. 12/04/21   Truddie Hidden, MD  Multiple Vitamin (MULTIVITAMIN WITH MINERALS) TABS tablet Take 1 tablet by mouth daily.    [provider]  Multiple Vitamins-Minerals (HAIR/SKIN/NAILS) TABS Take 1 tablet by mouth every other day.    [provider]  naproxen (NAPROSYN) 500 MG  tablet Take 500 mg by mouth 2 (two) times daily. 11/01/20   [provider]  oxyCODONE-acetaminophen (PERCOCET/ROXICET) 5-325 MG tablet Take 1 tablet by mouth every 6 (six) hours as needed for severe pain. 12/01/21   Harriet Pho, PA-C  Probiotic Product (PROBIOTIC DAILY PO) Take 1 tablet by mouth daily.    [provider]  traMADol (ULTRAM) 50 MG tablet Take 50 mg by mouth every 6 (six) hours as needed. 11/01/20   [provider]  venlafaxine XR (EFFEXOR-XR) 75 MG 24 hr capsule Take 75 mg by mouth daily. 11/01/20   [provider]      Allergies    Meloxicam    Review of Systems   Review of Systems  All other systems reviewed and are negative.   Physical Exam Updated Vital Signs BP (!) 167/91 (BP Location: Right Arm)   Pulse 74   Temp 98.3 F (36.8 C) (Oral)   Resp 19   Ht '5\' 6"'$  (1.676 m)   Wt 69.4 kg   LMP 03/22/2012   SpO2 100%   BMI 24.69 kg/m  Physical Exam Vitals and nursing note reviewed.  Constitutional:      General: She is not in acute distress.    Appearance: She is well-developed. She is not diaphoretic.  HENT:     Head: Normocephalic and atraumatic.  Cardiovascular:  Rate and Rhythm: Normal rate and regular rhythm.     Heart sounds: No murmur heard.    No friction rub. No gallop.  Pulmonary:     Effort: Pulmonary effort is normal. No respiratory distress.     Breath sounds: Normal breath sounds. No wheezing.  Abdominal:     General: Bowel sounds are normal. There is no distension.     Palpations: Abdomen is soft.     Tenderness: There is no abdominal tenderness.  Musculoskeletal:        General: Normal range of motion.     Cervical back: Normal range of motion and neck supple.     Comments: There is tenderness to palpation in the soft tissues of the left lower lumbar region.  Skin:    General: Skin is warm and dry.  Neurological:     General: No focal deficit present.     Mental Status: She is alert and  oriented to person, place, and time.     Comments: DTRs are 1+ and symmetrical in the patellar and Achilles tendons bilaterally.  Strength seems slightly diminished with plantarflexion and knee extension, however I suspect this is related to pain.  Sensation is intact.     ED Results / Procedures / Treatments   Labs (all labs ordered are listed, but only abnormal results are displayed) Labs Reviewed - No data to display  EKG None  Radiology No results found.  Procedures Procedures    Medications Ordered in ED Medications  ketorolac (TORADOL) injection 60 mg (has no administration in time range)  HYDROmorphone (DILAUDID) injection 2 mg (has no administration in time range)    ED Course/ Medical Decision Making/ A&P  Patient is a 61 year old female with history of sciatica presenting with complaints of left-sided low back pain radiating into her left leg.  This has been worsening over the past week.  She has been seen by her primary doctor and in the ER on 2 prior occasions.  She has had x-rays and CT scan performed, however no definitive cause was identified.  Patient has a referral to neurosurgery, but this is scheduled for next week.  Her pain worsened and she is describing numbness of the left leg.  Patient arrives here with stable vital signs and is afebrile.  On exam, she has symmetrical reflexes and strength.  She is able to ambulate, but with antalgic gait.  There are no other complaints that would indicate an emergent situation such as bowel or bladder incontinence.  Patient given IM Dilaudid and IM Toradol with good pain relief.  I feel as though patient will require an urgent MRI due to the level of her discomfort and will order this as an outpatient.  She will also be given prednisone and Percocet to take at home.  She has been taking a very low-dose of prednisone and I will start her out on 60 mg daily, then taper down from there.  Final Clinical Impression(s) / ED  Diagnoses Final diagnoses:  None    Rx / DC Orders ED Discharge Orders     None         Veryl Speak, MD 12/07/21 Titus Mould    Veryl Speak, MD 12/07/21 [phone]

## 2021-12-11 ENCOUNTER — Ambulatory Visit
Admission: RE | Admit: 2021-12-11 | Discharge: 2021-12-11 | Disposition: A | Discharge status: Home / Self Care | Payer: Commercial Managed Care - HMO | Source: Ambulatory Visit | Attending: Internal Medicine | Admitting: Internal Medicine

## 2021-12-11 DIAGNOSIS — Z1231 Encounter for screening mammogram for malignant neoplasm of breast: Secondary | ICD-10-CM

## 2022-01-02 ENCOUNTER — Institutional Professional Consult (permissible substitution): Payer: Self-pay | Admitting: Plastic Surgery

## 2022-01-03 ENCOUNTER — Ambulatory Visit (INDEPENDENT_AMBULATORY_CARE_PROVIDER_SITE_OTHER): Payer: Commercial Managed Care - HMO | Admitting: Nurse Practitioner

## 2022-01-03 ENCOUNTER — Encounter: Payer: Self-pay | Admitting: Nurse Practitioner

## 2022-01-03 VITALS — BP 150/80 | HR 86 | Temp 97.3°F | Ht 66.0 in | Wt 157.0 lb

## 2022-01-03 DIAGNOSIS — M5442 Lumbago with sciatica, left side: Secondary | ICD-10-CM

## 2022-01-03 DIAGNOSIS — R030 Elevated blood-pressure reading, without diagnosis of hypertension: Secondary | ICD-10-CM

## 2022-01-03 DIAGNOSIS — Z9889 Other specified postprocedural states: Secondary | ICD-10-CM | POA: Insufficient documentation

## 2022-01-03 DIAGNOSIS — N83209 Unspecified ovarian cyst, unspecified side: Secondary | ICD-10-CM | POA: Insufficient documentation

## 2022-01-03 NOTE — Assessment & Plan Note (Signed)
Possibly situational: back pain BP Readings from Last 3 Encounters:  01/03/22 (!) 150/80  12/07/21 (!) 156/83  12/04/21 (!) 158/78    Advised about need for DASH diet and monitoring BP at home F/up in 36month

## 2022-01-03 NOTE — Assessment & Plan Note (Signed)
onset 29monthago, onset of pain in am (getting out of bed), denies any injury, pain radiates to left upper leg. She had 3 ED visits and appt with previus pcp: Treated with toradol IM, oxycodone, hydrocodone, muscle relaxant and oral corticosteriod. No relief with these medications. CT lumbar spine: Mild L5-S1 degenerative disc disease without spinal canal or neural foraminal stenosis. [facility] Dr. GKary Koson 12/29/2021, MRI completed, Epidural injection administered-minimal relief. Next appt 01/16/2022. Current use of NSAIDs, muscle relaxant and tylenol. Advised to maintain

## 2022-01-03 NOTE — Patient Instructions (Signed)
Thank you for choosing [facility]  Sign medical release form to get your records from [facility], Dr. Cletis Media, and [facility]  Maintain appt with Dr. Saintclair Halsted Use muscle relexant, tylenol '650mg'$  every 8hrs and ibuprofen '600mg'$  every 8hrs or Naproxen '500mg'$  every 12hrs as needed for pain. Do not take advil/motrin/ibuprofen and aleve/naproxen together. They are bother NSAIDs Take NSAIDs with food.  Monitor BP at home with upper arm cuff: 3x/week in AM. Send BP readings via mychart in 1-2weeks Maintain DASH diet to help improve BP control.  DASH Eating Plan DASH stands for Dietary Approaches to Stop Hypertension. The DASH eating plan is a healthy eating plan that has been shown to: Reduce high blood pressure (hypertension). Reduce your risk for type 2 diabetes, heart disease, and stroke. Help with weight loss. What are tips for following this plan? Reading food labels Check food labels for the amount of salt (sodium) per serving. Choose foods with less than 5 percent of the Daily Value of sodium. Generally, foods with less than 300 milligrams (mg) of sodium per serving fit into this eating plan. To find whole grains, look for the word "whole" as the first word in the ingredient list. Shopping Buy products labeled as "low-sodium" or "no salt added." Buy fresh foods. Avoid canned foods and pre-made or frozen meals. Cooking Avoid adding salt when cooking. Use salt-free seasonings or herbs instead of table salt or sea salt. Check with your health care provider or pharmacist before using salt substitutes. Do not fry foods. Cook foods using healthy methods such as baking, boiling, grilling, roasting, and broiling instead. Cook with heart-healthy oils, such as olive, canola, avocado, soybean, or sunflower oil. Meal planning  Eat a balanced diet that includes: 4 or more servings of fruits and 4 or more servings of vegetables each day. Try to fill one-half of your plate with fruits  and vegetables. 6-8 servings of whole grains each day. Less than 6 oz (170 g) of lean meat, poultry, or fish each day. A 3-oz (85-g) serving of meat is about the same size as a deck of cards. One egg equals 1 oz (28 g). 2-3 servings of low-fat dairy each day. One serving is 1 cup (237 mL). 1 serving of nuts, seeds, or beans 5 times each week. 2-3 servings of heart-healthy fats. Healthy fats called omega-3 fatty acids are found in foods such as walnuts, flaxseeds, fortified milks, and eggs. These fats are also found in cold-water fish, such as sardines, salmon, and mackerel. Limit how much you eat of: Canned or prepackaged foods. Food that is high in trans fat, such as some fried foods. Food that is high in saturated fat, such as fatty meat. Desserts and other sweets, sugary drinks, and other foods with added sugar. Full-fat dairy products. Do not salt foods before eating. Do not eat more than 4 egg yolks a week. Try to eat at least 2 vegetarian meals a week. Eat more home-cooked food and less restaurant, buffet, and fast food. Lifestyle When eating at a restaurant, ask that your food be prepared with less salt or no salt, if possible. If you drink alcohol: Limit how much you use to: 0-1 drink a day for women who are not pregnant. 0-2 drinks a day for men. Be aware of how much alcohol is in your drink. In the [place] one drink equals one 12 oz bottle of beer (355 mL), one 5 oz glass of wine (148 mL), or one 1 oz glass  of hard liquor (44 mL). General information Avoid eating more than 2,300 mg of salt a day. If you have hypertension, you may need to reduce your sodium intake to 1,500 mg a day. Work with your health care provider to maintain a healthy body weight or to lose weight. Ask what an ideal weight is for you. Get at least 30 minutes of exercise that causes your heart to beat faster (aerobic exercise) most days of the week. Activities may include walking, swimming, or biking. Work with  your health care provider or dietitian to adjust your eating plan to your individual calorie needs. What foods should I eat? Fruits All fresh, dried, or frozen fruit. Canned fruit in natural juice (without added sugar). Vegetables Fresh or frozen vegetables (raw, steamed, roasted, or grilled). Low-sodium or reduced-sodium tomato and vegetable juice. Low-sodium or reduced-sodium tomato sauce and tomato paste. Low-sodium or reduced-sodium canned vegetables. Grains Whole-grain or whole-wheat bread. Whole-grain or whole-wheat pasta. Brown rice. [place]row. Bulgur. Whole-grain and low-sodium cereals. Pita bread. Low-fat, low-sodium crackers. Whole-wheat flour tortillas. Meats and other proteins Skinless chicken or [place]. Ground chicken or [place]. Pork with fat trimmed off. Fish and seafood. Egg whites. Dried beans, peas, or lentils. Unsalted nuts, nut butters, and seeds. Unsalted canned beans. Lean cuts of beef with fat trimmed off. Low-sodium, lean precooked or cured meat, such as sausages or meat loaves. Dairy Low-fat (1%) or fat-free (skim) milk. Reduced-fat, low-fat, or fat-free cheeses. Nonfat, low-sodium ricotta or cottage cheese. Low-fat or nonfat yogurt. Low-fat, low-sodium cheese. Fats and oils Soft margarine without trans fats. Vegetable oil. Reduced-fat, low-fat, or light mayonnaise and salad dressings (reduced-sodium). Canola, safflower, olive, avocado, soybean, and sunflower oils. Avocado. Seasonings and condiments Herbs. Spices. Seasoning mixes without salt. Other foods Unsalted popcorn and pretzels. Fat-free sweets. The items listed above may not be a complete list of foods and beverages you can eat. Contact a dietitian for more information. What foods should I avoid? Fruits Canned fruit in a light or heavy syrup. Fried fruit. Fruit in cream or butter sauce. Vegetables Creamed or fried vegetables. Vegetables in a cheese sauce. Regular canned vegetables (not low-sodium or  reduced-sodium). Regular canned tomato sauce and paste (not low-sodium or reduced-sodium). Regular tomato and vegetable juice (not low-sodium or reduced-sodium). Angie Fava. Olives. Grains Baked goods made with fat, such as croissants, muffins, or some breads. Dry pasta or rice meal packs. Meats and other proteins Fatty cuts of meat. Ribs. Fried meat. Berniece Salines. Bologna, salami, and other precooked or cured meats, such as sausages or meat loaves. Fat from the back of a pig (fatback). Bratwurst. Salted nuts and seeds. Canned beans with added salt. Canned or smoked fish. Whole eggs or egg yolks. Chicken or [place] with skin. Dairy Whole or 2% milk, cream, and half-and-half. Whole or full-fat cream cheese. Whole-fat or sweetened yogurt. Full-fat cheese. Nondairy creamers. Whipped toppings. Processed cheese and cheese spreads. Fats and oils Butter. Stick margarine. Lard. Shortening. Ghee. Bacon fat. Tropical oils, such as coconut, palm kernel, or palm oil. Seasonings and condiments Onion salt, garlic salt, seasoned salt, table salt, and sea salt. Worcestershire sauce. Tartar sauce. Barbecue sauce. Teriyaki sauce. Soy sauce, including reduced-sodium. Steak sauce. Canned and packaged gravies. Fish sauce. Oyster sauce. Cocktail sauce. Store-bought horseradish. Ketchup. Mustard. Meat flavorings and tenderizers. Bouillon cubes. Hot sauces. Pre-made or packaged marinades. Pre-made or packaged taco seasonings. Relishes. Regular salad dressings. Other foods Salted popcorn and pretzels. The items listed above may not be a complete list of foods and beverages  you should avoid. Contact a dietitian for more information. Where to find more information National Heart, Lung, and Blood Institute: https://wilson-eaton.com/ American Heart Association: www.heart.org Academy of Nutrition and Dietetics: www.eatright.[place]: www.kidney.org Summary The DASH eating plan is a healthy eating plan that has been shown  to reduce high blood pressure (hypertension). It may also reduce your risk for type 2 diabetes, heart disease, and stroke. When on the DASH eating plan, aim to eat more fresh fruits and vegetables, whole grains, lean proteins, low-fat dairy, and heart-healthy fats. With the DASH eating plan, you should limit salt (sodium) intake to 2,300 mg a day. If you have hypertension, you may need to reduce your sodium intake to 1,500 mg a day. Work with your health care provider or dietitian to adjust your eating plan to your individual calorie needs. This information is not intended to replace advice given to you by your health care provider. Make sure you discuss any questions you have with your health care provider. Document Revised: 01/09/2019 Document Reviewed: 01/09/2019 Elsevier Patient Education  [place].

## 2022-01-03 NOTE — Progress Notes (Signed)
Established Patient Visit  Patient: Julie Mcdonald   DOB: 1960/09/04   61 y.o. Female  MRN: [id number] Visit Date: 01/03/2022  Subjective:    Chief Complaint  Patient presents with  . Establish Care    New Pt, Est Care  Sciatic nerve pain  Requesting records for pap   HPI Previous pcp: [facility]YN: Dr. Cletis Media, last PAP 66yr, normal per patient. Last mammogram 11/2021 Last colonoscopy 259yrago-report requested.  Elevated BP without diagnosis of hypertension Possibly situational: back pain BP Readings from Last 3 Encounters:  01/03/22 (!) 150/80  12/07/21 (!) 156/83  12/04/21 (!) 158/78    Advised about need for DASH diet and monitoring BP at home F/up in 86m286monthcute bilateral low back pain with left-sided sciatica onset 86mo386month, onset of pain in am (getting out of bed), denies any injury, pain radiates to left upper leg. She had 3 ED visits and appt with previus pcp: Treated with toradol IM, oxycodone, hydrocodone, muscle relaxant and oral corticosteriod. No relief with these medications. CT lumbar spine: Mild L5-S1 degenerative disc disease without spinal canal or neural foraminal stenosis. Eval by [facility]: Dr. GaryKary Kos11/11/2021, MRI completed, Epidural injection administered-minimal relief. Next appt 01/16/2022. Current use of NSAIDs, muscle relaxant and tylenol. Advised to maintain   Reviewed medical, surgical, and social history today  Medications: Outpatient Medications Prior to Visit  Medication Sig  . cetirizine (ZYRTEC) 10 MG tablet Take 10 mg by mouth daily.  . Cyanocobalamin (VITAMIN B 12 PO) Take by mouth.  . ibMarland Kitchenprofen (ADVIL) 800 MG tablet ibuprofen 800 mg tablet  TAKE 1 TABLET BY MOUTH TWICE A DAY IF NEEDED WITH FOOD OR MILK  . methocarbamol (ROBAXIN) 500 MG tablet Take 1 tablet (500 mg total) by mouth 2 (two) times daily.  . Multiple Vitamin (MULTIVITAMIN WITH MINERALS) TABS tablet Take 1 tablet by mouth  daily.  . Multiple Vitamins-Minerals (HAIR/SKIN/NAILS) TABS Take 1 tablet by mouth every other day.  . Probiotic Product (PROBIOTIC DAILY PO) Take 1 tablet by mouth daily.  . ALMarland KitchenRAZolam (XANAX) 0.5 MG tablet Take 0.5 mg by mouth 2 (two) times daily as needed. (Patient not taking: Reported on 01/03/2022)  . Cholecalciferol (VITAMIN D PO) Take 1 tablet by mouth daily. (Patient not taking: Reported on 01/03/2022)  . naproxen (NAPROSYN) 500 MG tablet Take 500 mg by mouth 2 (two) times daily. (Patient not taking: Reported on 01/03/2022)  . oxyCODONE-acetaminophen (PERCOCET) 5-325 MG tablet Take 1-2 tablets by mouth every 6 (six) hours as needed. (Patient not taking: Reported on 01/03/2022)  . predniSONE (DELTASONE) 20 MG tablet 3 Tabs PO Days 1-3, then 2 tabs PO Days 4-6, then 1 tab PO Day 7-9, then Half Tab PO Day 10-12 (Patient not taking: Reported on 01/03/2022)  . traMADol (ULTRAM) 50 MG tablet Take 50 mg by mouth every 6 (six) hours as needed. (Patient not taking: Reported on 01/03/2022)  . venlafaxine XR (EFFEXOR-XR) 75 MG 24 hr capsule Take 75 mg by mouth daily. (Patient not taking: Reported on 01/03/2022)   Facility-Administered Medications Prior to Visit  Medication Dose Route Frequency Provider  . 0.9 %  sodium chloride infusion  500 mL Intravenous Once GessGatha Mayer   Reviewed past medical and social history.   Review of Systems  Constitutional:  Negative for weight loss.  Gastrointestinal:  Negative for constipation and diarrhea.  Musculoskeletal:  Positive  for back pain. Negative for falls.  Neurological:  Negative for weakness.    Last CBC Lab Results  Component Value Date   WBC 6.9 12/04/2021   HGB 11.9 (L) 12/04/2021   HCT 37.8 12/04/2021   MCV 84.0 12/04/2021   MCH 26.4 12/04/2021   RDW 13.5 12/04/2021   PLT 249 02/72/5366   Last metabolic panel Lab Results  Component Value Date   GLUCOSE 126 (H) 12/04/2021   NA 138 12/04/2021   K 3.5 12/04/2021   CL 102  12/04/2021   CO2 27 12/04/2021   BUN 11 12/04/2021   CREATININE 0.62 12/04/2021   GFRNONAA >60 12/04/2021   CALCIUM 9.4 12/04/2021   PROT 7.5 06/04/2014   ALBUMIN 4.1 06/04/2014   BILITOT 0.6 06/04/2014   ALKPHOS 91 06/04/2014   AST 20 06/04/2014   ALT 15 06/04/2014   ANIONGAP 9 12/04/2021      Objective:  BP (!) 150/80   Pulse 86   Temp (!) 97.3 F (36.3 C) (Temporal)   Ht '5\' 6"'$  (1.676 m)   Wt 157 lb (71.2 kg)   LMP 03/22/2012   SpO2 98%   BMI 25.34 kg/m      Physical Exam Vitals reviewed.  Cardiovascular:     Rate and Rhythm: Normal rate and regular rhythm.     Pulses: Normal pulses.     Heart sounds: Normal heart sounds.  Pulmonary:     Effort: Pulmonary effort is normal.     Breath sounds: Normal breath sounds.  Abdominal:     General: Bowel sounds are normal.     Palpations: Abdomen is soft.  Musculoskeletal:        General: Tenderness present.     Right lower leg: No edema.     Left lower leg: No edema.  Neurological:     Mental Status: She is oriented to person, place, and time.    No results found for any visits on 01/03/22.    Assessment & Plan:    Problem List Items Addressed This Visit       Nervous and Auditory   Acute bilateral low back pain with left-sided sciatica    onset 75monthago, onset of pain in am (getting out of bed), denies any injury, pain radiates to left upper leg. She had 3 ED visits and appt with previus pcp: Treated with toradol IM, oxycodone, hydrocodone, muscle relaxant and oral corticosteriod. No relief with these medications. CT lumbar spine: Mild L5-S1 degenerative disc disease without spinal canal or neural foraminal stenosis. Eval [facility]: Dr. GKary Koson 12/29/2021, MRI completed, Epidural injection administered-minimal relief. Next appt 01/16/2022. Current use of NSAIDs, muscle relaxant and tylenol. Advised to maintain         Other   Elevated BP without diagnosis of hypertension - Primary     Possibly situational: back pain BP Readings from Last 3 Encounters:  01/03/22 (!) 150/80  12/07/21 (!) 156/83  12/04/21 (!) 158/78    Advised about need for DASH diet and monitoring BP at home F/up in 160month    Return in about 4 weeks (around 01/31/2022) for CPE (fasting), HTN.     ChWilfred LacyNP

## 2022-01-18 ENCOUNTER — Other Ambulatory Visit: Payer: Self-pay | Admitting: Neurosurgery

## 2022-01-19 NOTE — Progress Notes (Signed)
Surgical Instructions    Your procedure is scheduled on Monday December 11th.  Report to [facility] Main Entrance "A" at 11 A.M., then check in with the Admitting office.  Call this number if you have problems the morning of surgery:  [phone]   If you have any questions prior to your surgery date call [phone]: Open Monday-Friday 8am-4pm If you experience any cold or flu symptoms such as cough, fever, chills, shortness of breath, etc. between now and your scheduled surgery, please notify us at the above number     Remember:  Do not eat after midnight the night before your surgery  You may drink clear liquids until 10am the morning of your surgery.   Clear liquids allowed are: Water, Non-Citrus Juices (without pulp), Carbonated Beverages, Clear Tea, Black Coffee ONLY (NO MILK, CREAM OR POWDERED CREAMER of any kind), and Gatorade    Take these medicines the morning of surgery with A SIP OF WATER: cetirizine (ZYRTEC) 10 MG tablet    IF NEEDED  acetaminophen (TYLENOL) 500 MG tablet  HYDROcodone-acetaminophen (NORCO/VICODIN) 5-325 MG tablet  methocarbamol (ROBAXIN) 500 MG tablet    As of today, STOP taking any Aspirin (unless otherwise instructed by your surgeon) Voltaren, Aleve, Naproxen, Ibuprofen, Motrin, Advil, Goody's, BC's, all herbal medications, fish oil, and all vitamins.           Do not wear jewelry or makeup. Do not wear lotions, powders, perfumes or deodorant. Do not shave 48 hours prior to surgery.   Do not bring valuables to the hospital. Do not wear nail polish, gel polish, artificial nails, or any other type of covering on natural nails (fingers and toes) If you have artificial nails or gel coating that need to be removed by a nail salon, please have this removed prior to surgery. Artificial nails or gel coating may interfere with anesthesia's ability to adequately monitor your vital signs.  [facility] is not responsible for any belongings or valuables.     Do NOT Smoke (Tobacco/Vaping)  24 hours prior to your procedure  If you use a CPAP at night, you may bring your mask for your overnight stay.   Contacts, glasses, hearing aids, dentures or partials may not be worn into surgery, please bring cases for these belongings   For patients admitted to the hospital, discharge time will be determined by your treatment team.   Patients discharged the day of surgery will not be allowed to drive home, and someone needs to stay with them for 24 hours.   SURGICAL WAITING ROOM VISITATION Patients having surgery or a procedure may have no more than 2 support people in the waiting area - these visitors may rotate.   Children under the age of 51 must have an adult with them who is not the patient. If the patient needs to stay at the hospital during part of their recovery, the visitor guidelines for inpatient rooms apply. Pre-op nurse will coordinate an appropriate time for 1 support person to accompany patient in pre-op.  This support person may not rotate.   Please refer to [email or web address] for the visitor guidelines for Inpatients (after your surgery is over and you are in a regular room).    Special instructions:    Oral Hygiene is also important to reduce your risk of infection.  Remember - BRUSH YOUR TEETH THE MORNING OF SURGERY WITH YOUR REGULAR TOOTHPASTE   [facility]- Preparing For Surgery  Before surgery, you can play an important role. Because skin  is not sterile, your skin needs to be as free of germs as possible. You can reduce the number of germs on your skin by washing with CHG (chlorahexidine gluconate) Soap before surgery.  CHG is an antiseptic cleaner which kills germs and bonds with the skin to continue killing germs even after washing.     Please do not use if you have an allergy to CHG or antibacterial soaps. If your skin becomes reddened/irritated stop using the CHG.  Do not  shave (including legs and underarms) for at least 48 hours prior to first CHG shower. It is OK to shave your face.  Please follow these instructions carefully.     Shower the NIGHT BEFORE SURGERY and the MORNING OF SURGERY with CHG Soap.   If you chose to wash your hair, wash your hair first as usual with your normal shampoo. After you shampoo, rinse your hair and body thoroughly to remove the shampoo.  Then [employer] and genitals (private parts) with your normal soap and rinse thoroughly to remove soap.  After that Use CHG Soap as you would any other liquid soap. You can apply CHG directly to the skin and wash gently with a scrungie or a clean washcloth.   Apply the CHG Soap to your body ONLY FROM THE NECK DOWN.  Do not use on open wounds or open sores. Avoid contact with your eyes, ears, mouth and genitals (private parts). Wash Face and genitals (private parts)  with your normal soap.   Wash thoroughly, paying special attention to the area where your surgery will be performed.  Thoroughly rinse your body with warm water from the neck down.  DO NOT shower/wash with your normal soap after using and rinsing off the CHG Soap.  Pat yourself dry with a CLEAN TOWEL.  Wear CLEAN PAJAMAS to bed the night before surgery  Place CLEAN SHEETS on your bed the night before your surgery  DO NOT SLEEP WITH PETS.   Day of Surgery:  Take a shower with CHG soap. Wear Clean/Comfortable clothing the morning of surgery Do not apply any deodorants/lotions.   Remember to brush your teeth WITH YOUR REGULAR TOOTHPASTE.    If you received a COVID test during your pre-op visit, it is requested that you wear a mask when out in public, stay away from anyone that may not be feeling well, and notify your surgeon if you develop symptoms. If you have been in contact with anyone that has tested positive in the last 10 days, please notify your surgeon.    Please read over the following fact sheets that you were  given.

## 2022-01-22 ENCOUNTER — Encounter (HOSPITAL_COMMUNITY): Payer: Self-pay

## 2022-01-22 ENCOUNTER — Encounter (HOSPITAL_COMMUNITY)
Admission: RE | Admit: 2022-01-22 | Discharge: 2022-01-22 | Disposition: A | Discharge status: Home / Self Care | Payer: Commercial Managed Care - HMO | Source: Ambulatory Visit | Attending: Neurosurgery | Admitting: Neurosurgery

## 2022-01-22 ENCOUNTER — Other Ambulatory Visit: Payer: Self-pay

## 2022-01-22 DIAGNOSIS — I10 Essential (primary) hypertension: Secondary | ICD-10-CM

## 2022-01-22 DIAGNOSIS — R030 Elevated blood-pressure reading, without diagnosis of hypertension: Secondary | ICD-10-CM

## 2022-01-22 DIAGNOSIS — M5137 Other intervertebral disc degeneration, lumbosacral region: Secondary | ICD-10-CM | POA: Insufficient documentation

## 2022-01-22 DIAGNOSIS — M1990 Unspecified osteoarthritis, unspecified site: Secondary | ICD-10-CM | POA: Insufficient documentation

## 2022-01-22 DIAGNOSIS — Z9011 Acquired absence of right breast and nipple: Secondary | ICD-10-CM | POA: Diagnosis not present

## 2022-01-22 DIAGNOSIS — I700 Atherosclerosis of aorta: Secondary | ICD-10-CM | POA: Insufficient documentation

## 2022-01-22 DIAGNOSIS — I471 Supraventricular tachycardia, unspecified: Secondary | ICD-10-CM | POA: Diagnosis not present

## 2022-01-22 DIAGNOSIS — K219 Gastro-esophageal reflux disease without esophagitis: Secondary | ICD-10-CM | POA: Insufficient documentation

## 2022-01-22 DIAGNOSIS — Z8719 Personal history of other diseases of the digestive system: Secondary | ICD-10-CM | POA: Insufficient documentation

## 2022-01-22 DIAGNOSIS — Z86010 Personal history of colonic polyps: Secondary | ICD-10-CM | POA: Diagnosis not present

## 2022-01-22 DIAGNOSIS — Z01818 Encounter for other preprocedural examination: Secondary | ICD-10-CM | POA: Diagnosis present

## 2022-01-22 DIAGNOSIS — D649 Anemia, unspecified: Secondary | ICD-10-CM

## 2022-01-22 DIAGNOSIS — Z79899 Other long term (current) drug therapy: Secondary | ICD-10-CM | POA: Insufficient documentation

## 2022-01-22 HISTORY — DX: Essential (primary) hypertension: I10

## 2022-01-22 LAB — BASIC METABOLIC PANEL WITH GFR
Anion gap: 8 (ref 5–15)
BUN: 10 mg/dL (ref 6–20)
CO2: 28 mmol/L (ref 22–32)
Calcium: 9.5 mg/dL (ref 8.9–10.3)
Chloride: 108 mmol/L (ref 98–111)
Creatinine, Ser: 0.72 mg/dL (ref 0.44–1.00)
GFR, Estimated: >60 mL/min
Glucose, Bld: 90 mg/dL (ref 70–99)
Potassium: 3.5 mmol/L (ref 3.5–5.1)
Sodium: 144 mmol/L (ref 135–145)

## 2022-01-22 LAB — SURGICAL PCR SCREEN

## 2022-01-22 LAB — CBC
HCT: 36.7 % (ref 36.0–46.0)
Hemoglobin: 11.7 g/dL — ABNORMAL LOW (ref 12.0–15.0)
MCH: 26.4 pg (ref 26.0–34.0)
MCHC: 31.9 g/dL (ref 30.0–36.0)
MCV: 82.8 fL (ref 80.0–100.0)
Platelets: 272 K/uL (ref 150–400)
RBC: 4.43 MIL/uL (ref 3.87–5.11)
RDW: 13.9 % (ref 11.5–15.5)
WBC: 5.8 K/uL (ref 4.0–10.5)
nRBC: 0 % (ref 0.0–0.2)

## 2022-01-22 NOTE — Progress Notes (Addendum)
Surgical Instructions    Your procedure is scheduled on Monday December 11th.  Report to [facility] Main Entrance "A" at 11 A.M., then check in with the Admitting office. Your surgery is scheduled to begin at 1:00 PM  Call this number if you have problems the morning of surgery:  [phone]- this is the pre surgery desk.   If you have any questions prior to your surgery date call [phone]: Open Monday-Friday 8am-4pm If you experience any cold or flu symptoms such as cough, fever, chills, shortness of breath, etc. between now and your scheduled surgery, please notify us at the above number    Remember:  Do not eat or drink after midnight the night before your surgery    Take these medicines the morning of surgery with A SIP OF WATER: cetirizine (ZYRTEC) 10 MG tablet   IF NEEDED  acetaminophen (TYLENOL) 500 MG tablet  HYDROcodone-acetaminophen (NORCO/VICODIN) 5-325 MG tablet  methocarbamol (ROBAXIN) 500 MG tablet    As of today, STOP taking any Aspirin (unless otherwise instructed by your surgeon) Voltaren, Aleve, Naproxen, Ibuprofen, Motrin, Advil, Goody's, BC's, all herbal medications, fish oil, and all vitamins.             Special instructions:    Oral Hygiene is also important to reduce your risk of infection.  Remember - BRUSH YOUR TEETH THE MORNING OF SURGERY WITH YOUR REGULAR TOOTHPASTE   [place]- Preparing For Surgery  Before surgery, you can play an important role. Because skin is not sterile, your skin needs to be as free of germs as possible. You can reduce the number of germs on your skin by washing with CHG (chlorahexidine gluconate) Soap before surgery.  CHG is an antiseptic cleaner which kills germs and bonds with the skin to continue killing germs even after washing.     Please do not use if you have an allergy to CHG or antibacterial soaps. If your skin becomes reddened/irritated stop using the CHG.  Do not shave (including legs and underarms) for at least  48 hours prior to first CHG shower. It is OK to shave your face.  Please follow these instructions carefully.    Shower the NIGHT BEFORE SURGERY and the MORNING OF SURGERY with CHG Soap.   If you chose to wash your hair, wash your hair first as usual with your normal shampoo. After you shampoo, rinse your hair and body thoroughly to remove the shampoo.  Then [employer] and genitals (private parts) with your normal soap and rinse thoroughly to remove soap.  After that Use CHG Soap as you would any other liquid soap. You can apply CHG directly to the skin and wash gently with a scrungie or a clean washcloth.   Apply the CHG Soap to your body ONLY FROM THE NECK DOWN.  Do not use on open wounds or open sores. Avoid contact with your eyes, ears, mouth and genitals (private parts).   Wash thoroughly, paying special attention to the area where your surgery will be performed.  Thoroughly rinse your body with warm water from the neck down.  DO NOT shower/wash with your normal soap after using and rinsing off the CHG Soap.  Pat yourself dry with a CLEAN TOWEL.  Wear CLEAN PAJAMAS to bed the night before surgery  Place CLEAN SHEETS on your bed the night before your surgery  DO NOT SLEEP WITH PETS.  Day of Surgery: Shower as instructed above Wear Clean/Comfortable clothing the morning of surgery Do not  apply any deodorants/lotions.   Remember to brush your teeth WITH YOUR REGULAR TOOTHPASTE.  If you received a COVID test during your pre-op visit, it is requested that you wear a mask when out in public, stay away from anyone that may not be feeling well, and notify your surgeon if you develop symptoms. If you have been in contact with anyone that has tested positive in the last 10 days, please notify your surgeon.    Please read over the following fact sheets that you were given.   Patients discharged the day of surgery will not be allowed to drive home, and someone needs to stay with them for  24 hours.   SURGICAL WAITING ROOM VISITATION Patients having surgery or a procedure may have no more than 2 support people in the waiting area - these visitors may rotate.   Children under the age of 76 must have an adult with them who is not the patient. If the patient needs to stay at the hospital during part of their recovery, the visitor guidelines for inpatient rooms apply. Pre-op nurse will coordinate an appropriate time for 1 support person to accompany patient in pre-op.  This support person may not rotate.   Please refer to [email or web address] for the visitor guidelines for Inpatients (after your surgery is over and you are in a regular room).

## 2022-01-22 NOTE — Progress Notes (Signed)
I spoke with Kenyon Ana, Dr. Windy Carina scheduler, I ask to have Dr. Saintclair Halsted sign orders.

## 2022-01-22 NOTE — Progress Notes (Addendum)
PCP - Max Sane, NP with [facility] at [facility].  Cardiologist - none Mrs Deangelo saw Dr. Alphia Moh a cardiologist At [facility], in 2020, patient was having palpations and chest pain. A CT angiogram was done- no blockages seen, patient wore a Zio monitor - I did not see the results for this. Mrs Santizo was started on Metoprolol bid, she is not longer on this.Patient said that cardiologist told her to follow up if needed. Mrs Hardie states she has not had anymore palpations or chest.  EP-no  Endocrine-no  Pulm-no  Chest x-ray - na  EKG - 01/22/2022  Stress Test - no  ECHO - ? At [facility]  Cardiac Cath - no  AICD-no PM-no LOOP-no  Sleep Study - no CPAP - no  LABS-CBC, BMP  ASA-no  ERAS-no  HA1C-na Fasting Blood Sugar - na Checks Blood Sugar _0_ times a day  Anesthesia-  Pt denies having chest pain, sob, or fever at this time. All instructions explained to the pt, with a verbal understanding of the material. Pt agrees to go over the instructions while at home for a better understanding. Pt also instructed to self quarantine after being tested for COVID-19. The opportunity to ask questions was provided.

## 2022-01-23 ENCOUNTER — Encounter (HOSPITAL_COMMUNITY): Payer: Self-pay

## 2022-01-23 NOTE — Progress Notes (Signed)
Patient has invalid surgical PCR and will need it repeated DOS.

## 2022-01-23 NOTE — Progress Notes (Signed)
Anesthesia Chart Review:  Case: [id number] Date/Time: 01/29/22 1221   Procedure: Left - L3-L4- extraforaminal microdiskectomy (Left: Back) - 3C   Anesthesia type: General   Pre-op diagnosis: Radiculopathy   Location: [facility] OR ROOM 20 / [place] OR   Surgeons: Kary Kos, MD       DISCUSSION: Patient is a 61 year old female scheduled for the above procedure.  History includes never smoker, HTN, palpitations (2020), GERD, anemia, arthritis, right breast lumpectomy (01/23/12, pathology: fibrocystic changes, no malignancy).  She is not routinely followed by cardiologist but did have evaluation by Alphia Moh, MD with [facility] in October 2020. She was seen for palpitations with chest pain. By notes, Ziopatch in May 2020 showed some short runs of SVT. He prescribed metoprolol. He also ordered a CCTA which showed Coronary calcium score of 0 with no evidence of CAD. At PAT, she reported she is no longer on a b-blocker, but reported chest and palpitations had resolved since then.    Anesthesia team to evaluate on the day of surgery.   VS: Pulse (P) 90   Temp (!) (P) 36.4 C (Oral)   Resp (P) 18   Ht (P) '5\' 6"'$  (1.676 m)   Wt (P) 70.2 kg   LMP 03/22/2012   SpO2 (P) 100%   BMI (P) 24.97 kg/m  BP Readings from Last 3 Encounters:  01/03/22 (!) 150/80  12/07/21 (!) 156/83  12/04/21 (!) 158/78     PROVIDERS: Flossie Buffy, NP is PCP    LABS: Preoperative labs noted.  She needs a repeat nasal PCR on the day of surgery. (all labs ordered are listed, but only abnormal results are displayed)  Labs Reviewed  SURGICAL PCR SCREEN - Abnormal; Notable for the following components:      Result Value   MRSA, PCR   (*)    Value: INVALID, UNABLE TO DETERMINE THE PRESENCE OF TARGET DUE TO SPECIMEN INTEGRITY. RECOLLECTION REQUESTED.   Staphylococcus aureus   (*)    Value: INVALID, UNABLE TO DETERMINE THE PRESENCE OF TARGET DUE TO SPECIMEN INTEGRITY. RECOLLECTION REQUESTED.   All other  components within normal limits  CBC - Abnormal; Notable for the following components:   Hemoglobin 11.7 (*)    All other components within normal limits  BASIC METABOLIC PANEL     IMAGES: CT L-spine 12/04/21: IMPRESSION: 1. No acute fracture or static subluxation of the lumbar spine. 2. Mild L5-S1 degenerative disc disease without spinal canal or neural foraminal stenosis. - Aortic Atherosclerosis (ICD10-I70.0).   EKG:  EKG 01/22/22:  Normal sinus rhythm Septal infarct , age undetermined - By result narrative in [facility], her 10/08/18 EKG showed SR, possible septal infarct (cited on or before 03/26/18).   CV: CCTA Heart 12/26/18 ([facility]): IMPRESSION: 1.  No obstructive coronary artery disease identified.  Patients total coronary artery calcium score is 0, which is 1st percentile for patients of matched age, gender and race/ethnicity.     ZioPatch monitor 3-14 days (06/26/18 [facility]): Results not viewable in [place]. Per 10/08/18 office note by Cathi Roan, PA-C at [facility], "Had ziopatch which showed shorts runs of SVT which were no patient-reported to be symptomatic."    Past Medical History:  Diagnosis Date   Allergy    Anemia    Anxiety    Arthritis    GERD (gastroesophageal reflux disease)    Hx of adenomatous polyp of colon 11/28/2020   Hypertension    Palpitations    CTA 2020 normal no  obstruction   Seasonal allergies    Wears glasses     Past Surgical History:  Procedure Laterality Date   BREAST LUMPECTOMY WITH NEEDLE LOCALIZATION  01/23/2012   Procedure: BREAST LUMPECTOMY WITH NEEDLE LOCALIZATION;  Surgeon: Merrie Roof, MD;  Location: [facility];  Service: General;  Laterality: Right;   BREAST SURGERY  02/20/1988   right breast removed cyst   COLONOSCOPY     last 1022 w/ 1 adenoma   TUBAL LIGATION      MEDICATIONS:  acetaminophen (TYLENOL) 500 MG tablet   cetirizine (ZYRTEC) 10 MG tablet   diclofenac Sodium (VOLTAREN  ARTHRITIS PAIN) 1 % GEL   HYDROcodone-acetaminophen (NORCO/VICODIN) 5-325 MG tablet   levocetirizine (XYZAL) 5 MG tablet   methocarbamol (ROBAXIN) 500 MG tablet   Multiple Vitamins-Minerals (EMERGEN-C VITAMIN C) PACK   oxyCODONE-acetaminophen (PERCOCET) 5-325 MG tablet   predniSONE (DELTASONE) 20 MG tablet   Probiotic Product (PROBIOTIC DAILY PO)    0.9 %  sodium chloride infusion   Current medication list she is not taking prednisone or Percocet.   Myra Gianotti, PA-C Surgical Short Stay/Anesthesiology [facility] Phone [phone] [facility] [place] Phone [phone] 01/23/2022 3:45 PM

## 2022-01-23 NOTE — Anesthesia Preprocedure Evaluation (Addendum)
Anesthesia Evaluation  Patient identified by MRN, date of birth, ID band Patient awake    Reviewed: Allergy & Precautions, NPO status , Patient's Chart, lab work & pertinent test results  Airway Mallampati: II  TM Distance: >3 FB Neck ROM: Limited    Dental no notable dental hx. (+) Dental Advisory Given, Teeth Intact   Pulmonary    Pulmonary exam normal breath sounds clear to auscultation       Cardiovascular hypertension, Normal cardiovascular exam Rhythm:Regular Rate:Normal     Neuro/Psych   Anxiety      Neuromuscular disease    GI/Hepatic ,GERD  Medicated,,  Endo/Other    Renal/GU      Musculoskeletal  (+) Arthritis ,    Abdominal   Peds  Hematology  (+) Blood dyscrasia, anemia   Anesthesia Other Findings   Reproductive/Obstetrics                              Anesthesia Physical Anesthesia Plan  ASA: 2  Anesthesia Plan: General   Post-op Pain Management: Tylenol PO (pre-op)* and Gabapentin PO (pre-op)*   Induction: Intravenous  PONV Risk Score and Plan: 3 and Ondansetron, Dexamethasone and Treatment may vary due to age or medical condition  Airway Management Planned: Oral ETT  Additional Equipment:   Intra-op Plan:   Post-operative Plan: Extubation in OR  Informed Consent: I have reviewed the patients History and Physical, chart, labs and discussed the procedure including the risks, benefits and alternatives for the proposed anesthesia with the patient or authorized representative who has indicated his/her understanding and acceptance.     Dental advisory given  Plan Discussed with: CRNA  Anesthesia Plan Comments: (PAT note written 01/23/2022 by Myra Gianotti, PA-C.  )        Anesthesia Quick Evaluation

## 2022-01-25 NOTE — Progress Notes (Deleted)
Initial neurology clinic note  SERVICE DATE: 02/01/22  Reason for Evaluation: Consultation requested by Selena Batten, NP for an opinion regarding ***. My final recommendations will be communicated back to the requesting physician by way of shared medical record or letter to requesting physician via [place] mail.  HPI: This is Ms. Phebe Colla, a 61 y.o. ***-handed female with a medical history of HTN, vit D deficiency, HLD, low back pain*** who presents to neurology clinic with the chief complaint of ***. The patient is accompanied by ***.  ***  Low back pain radiating into legs Given prednisone burst and lidocaine patch by PCP MRI lumbar spine ordered   Patient having surgery 01/29/22???***  The patient has not*** had similar episodes of symptoms in the past. ***  Muscle bulk loss? *** Muscle pain? ***  Cramps/Twitching? *** Suggestion of myotonia/difficulty relaxing after contraction? ***  Fatigable weakness?*** Does strength improve after brief exercise?***  Able to brush hair/teeth without difficulty? *** Able to button shirts/use zips? *** Clumsiness/dropping grasped objects?*** Can you arise from squatted position easily? *** Able to get out of chair without using arms? *** Able to walk up steps easily? *** Use an assistive device to walk? *** Significant imbalance with walking? *** Falls?*** Any change in urine color, especially after exertion/physical activity? ***  The patient denies*** symptoms suggestive of oculobulbar weakness including diplopia, ptosis, dysphagia, poor saliva control, dysarthria/dysphonia, impaired mastication, facial weakness/droop.  There are no*** neuromuscular respiratory weakness symptoms, particularly orthopnea>dyspnea.   Pseudobulbar affect is absent***.  The patient does not*** report symptoms referable to autonomic dysfunction including impaired sweating, heat or cold intolerance, excessive mucosal dryness, gastroparetic early satiety,  postprandial abdominal bloating, constipation, bowel or bladder dyscontrol, erectile dysfunction*** or syncope/presyncope/orthostatic intolerance.  There are no*** complaints relating to other symptoms of small fiber modalities including paresthesia/pain.  The patient has not *** noticed any recent skin rashes nor does he*** report any constitutional symptoms like fever, night sweats, anorexia or unintentional weight loss.  EtOH use: ***  Restrictive diet? *** Family history of neuropathy/myopathy/NM disease?***  Previous labs, electrodiagnostics, and neuroimaging are summarized below, but pertinent findings include***  Any biopsy done? *** Current medications being tried for the patient's symptoms include ***  Prior medications that have been tried: ***   MEDICATIONS:  Outpatient Encounter Medications as of 02/01/2022  Medication Sig   acetaminophen (TYLENOL) 500 MG tablet Take 1,000 mg by mouth every 6 (six) hours as needed for moderate pain.   cetirizine (ZYRTEC) 10 MG tablet Take 10 mg by mouth daily.   diclofenac Sodium (VOLTAREN ARTHRITIS PAIN) 1 % GEL Apply 2 g topically daily as needed (back pain).   HYDROcodone-acetaminophen (NORCO/VICODIN) 5-325 MG tablet Take 1 tablet by mouth every 6 (six) hours as needed for moderate pain.   levocetirizine (XYZAL) 5 MG tablet Take 5 mg by mouth every evening.   methocarbamol (ROBAXIN) 500 MG tablet Take 1 tablet (500 mg total) by mouth 2 (two) times daily. (Patient taking differently: Take 500 mg by mouth 3 (three) times daily.)   Multiple Vitamins-Minerals (EMERGEN-C VITAMIN C) PACK Take 1 Package by mouth daily.   oxyCODONE-acetaminophen (PERCOCET) 5-325 MG tablet Take 1-2 tablets by mouth every 6 (six) hours as needed. (Patient not taking: Reported on 01/03/2022)   predniSONE (DELTASONE) 20 MG tablet 3 Tabs PO Days 1-3, then 2 tabs PO Days 4-6, then 1 tab PO Day 7-9, then Half Tab PO Day 10-12 (Patient not taking: Reported on 01/03/2022)  Probiotic Product (PROBIOTIC DAILY PO) Take 1 tablet by mouth daily.   Facility-Administered Encounter Medications as of 02/01/2022  Medication   0.9 %  sodium chloride infusion    PAST MEDICAL HISTORY: Past Medical History:  Diagnosis Date   Allergy    Anemia    Anxiety    Arthritis    GERD (gastroesophageal reflux disease)    Hx of adenomatous polyp of colon 11/28/2020   Hypertension    Palpitations    CTA 2020 normal no obstruction   Seasonal allergies    Wears glasses     PAST SURGICAL HISTORY: Past Surgical History:  Procedure Laterality Date   BREAST LUMPECTOMY WITH NEEDLE LOCALIZATION  01/23/2012   Procedure: BREAST LUMPECTOMY WITH NEEDLE LOCALIZATION;  Surgeon: Merrie Roof, MD;  Location: [facility];  Service: General;  Laterality: Right;   BREAST SURGERY  02/20/1988   right breast removed cyst   COLONOSCOPY     last 1022 w/ 1 adenoma   TUBAL LIGATION      ALLERGIES: No Active Allergies  FAMILY HISTORY: Family History  Problem Relation Age of Onset   Heart disease Mother    Hypertension Mother    Breast cancer Maternal Aunt 70   Diabetes Maternal Grandmother    Heart disease Maternal Grandmother    Cancer Maternal Grandmother        colon   Diabetes Maternal Grandfather    Heart disease Maternal Grandfather    Cancer Maternal Grandfather    Colon cancer Maternal Great-grandmother    Esophageal cancer Neg Hx    Colon polyps Neg Hx    Rectal cancer Neg Hx    Stomach cancer Neg Hx     SOCIAL HISTORY: Social History   Tobacco Use   Smoking status: Never   Smokeless tobacco: Never  Vaping Use   Vaping Use: Never used  Substance Use Topics   Alcohol use: Not Currently   Drug use: No   Social History   Social History Narrative   Lives locally with husband.  Works as Theme park manager.  Very active and works out in gym with trainer several x/wk.     OBJECTIVE: PHYSICAL EXAM: LMP 03/22/2012   General:*** General appearance:  Awake and alert. No distress. Cooperative with exam.  Skin: No obvious rash or jaundice. HEENT: Atraumatic. Anicteric. Lungs: Non-labored breathing on room air  Heart: Regular Abdomen: Soft, non tender. Extremities: No edema. No obvious deformity.  Musculoskeletal: No obvious joint swelling. Psych: Affect appropriate.  Neurological: Mental Status: Alert. Speech fluent. No pseudobulbar affect Cranial Nerves: CNII: No RAPD. Visual fields grossly intact. CNIII, IV, VI: PERRL. No nystagmus. EOMI. CN V: Facial sensation intact bilaterally to fine touch. [place] clench strong. Jaw jerk***. CN VII: Facial muscles symmetric and strong. No ptosis at rest or after sustained upgaze***. CN VIII: Hearing grossly intact bilaterally. CN IX: No hypophonia. CN X: Palate elevates symmetrically. CN XI: Full strength shoulder shrug bilaterally. CN XII: Tongue protrusion full and midline. No atrophy or fasciculations. No significant dysarthria*** Motor: Tone is ***. *** fasciculations in *** extremities. *** atrophy. No grip or percussive myotonia.***  Individual muscle group testing (MRC grade out of 5):  Movement     Neck flexion ***    Neck extension ***     Right Left   Shoulder abduction *** ***   Shoulder adduction *** ***   Shoulder ext rotation *** ***   Shoulder int rotation *** ***   Elbow flexion *** ***  Elbow extension *** ***   Wrist extension *** ***   Wrist flexion *** ***   Finger abduction - FDI *** ***   Finger abduction - ADM *** ***   Finger extension *** ***   Finger distal flexion - 2/3 *** ***   Finger distal flexion - 4/5 *** ***   Thumb flexion - FPL *** ***   Thumb abduction - APB *** ***    Hip flexion *** ***   Hip extension *** ***   Hip adduction *** ***   Hip abduction *** ***   Knee extension *** ***   Knee flexion *** ***   Dorsiflexion *** ***   Plantarflexion *** ***   Inversion *** ***   Eversion *** ***   Great toe extension *** ***    Great toe flexion *** ***     Reflexes:  Right Left   Bicep *** ***   Tricep *** ***   BrRad *** ***   Knee *** ***   Ankle *** ***    Pathological Reflexes: Babinski: *** response bilaterally*** Hoffman: *** Troemner: *** Pectoral: *** Palmomental: *** Facial: *** Midline tap: *** Sensation: Pinprick: *** Vibration: *** Temperature: *** Proprioception: *** Coordination: Intact finger-to- nose-finger bilaterally. Romberg negative.*** Gait: Able to rise from chair with arms crossed unassisted. Normal, narrow-based gait. Able to tandem walk. Able to walk on toes and heels.***  Lab and Test Review: Internal labs: Normal or unremarkable: BMP and CBC (01/22/22) ***  External labs: ***  CT lumbar spine wo contrast (12/04/21): FINDINGS: Segmentation: 5 lumbar type vertebrae.   Alignment: Normal.   Vertebrae: No acute fracture or focal pathologic process.   Paraspinal and other soft tissues: Calcific aortic atherosclerosis.   Disc levels: At L5-S1, there is disc space narrowing with endplate degeneration and spurring but no spinal canal or neural foraminal stenosis.   IMPRESSION: 1. No acute fracture or static subluxation of the lumbar spine. 2. Mild L5-S1 degenerative disc disease without spinal canal or neural foraminal stenosis.  Lumbar spine xray (09/26/21): only have report Marked L5-S1 narrrowing ***  ASSESSMENT: QUINTAVIA ROGSTAD is a 61 y.o. female who presents for evaluation of ***. *** has a relevant medical history of ***. *** neurological examination is pertinent for ***. Available diagnostic data is significant for ***. This constellation of symptoms and objective data would most likely localize to ***. ***  PLAN: -Blood work: *** ***  -Return to clinic ***  The impression above as well as the plan as outlined below were extensively discussed with the patient (in the company of ***) who voiced understanding. All questions were answered to their  satisfaction.  The patient was counseled on pertinent fall precautions per the printed material provided today, and as noted under the "Patient Instructions" section below.***  When available, results of the above investigations and possible further recommendations will be communicated to the patient via telephone/MyChart. Patient to call office if not contacted after expected testing turnaround time.   Total time spent reviewing records, interview, history/exam, documentation, and coordination of care on day of encounter:  *** min   Thank you for allowing me to participate in patient's care.  If I can answer any additional questions, I would be pleased to do so.  Kai Levins, MD   CC: Nche, Charlene Brooke, NP [place] [id number]  CC: Referring provider: Selena Batten, NP [address] [place],  [place] [address]

## 2022-01-26 NOTE — Progress Notes (Signed)
left message for patient with new arrival time of 1030

## 2022-01-29 ENCOUNTER — Ambulatory Visit (HOSPITAL_BASED_OUTPATIENT_CLINIC_OR_DEPARTMENT_OTHER): Payer: Commercial Managed Care - HMO | Admitting: Physician Assistant

## 2022-01-29 ENCOUNTER — Other Ambulatory Visit: Payer: Self-pay

## 2022-01-29 ENCOUNTER — Encounter (HOSPITAL_COMMUNITY): Payer: Self-pay | Admitting: Neurosurgery

## 2022-01-29 ENCOUNTER — Ambulatory Visit (HOSPITAL_COMMUNITY)
Admission: RE | Admit: 2022-01-29 | Discharge: 2022-01-30 | Disposition: A | Discharge status: Home / Self Care | Payer: Commercial Managed Care - HMO | Source: Ambulatory Visit | Attending: Neurosurgery | Admitting: Neurosurgery

## 2022-01-29 ENCOUNTER — Ambulatory Visit (HOSPITAL_COMMUNITY): Payer: Commercial Managed Care - HMO | Admitting: Physician Assistant

## 2022-01-29 ENCOUNTER — Ambulatory Visit (HOSPITAL_COMMUNITY)
Admission: RE | Disposition: A | Discharge status: Home / Self Care | Payer: Self-pay | Source: Ambulatory Visit | Attending: Neurosurgery

## 2022-01-29 ENCOUNTER — Ambulatory Visit (HOSPITAL_COMMUNITY): Payer: Commercial Managed Care - HMO

## 2022-01-29 DIAGNOSIS — K219 Gastro-esophageal reflux disease without esophagitis: Secondary | ICD-10-CM | POA: Insufficient documentation

## 2022-01-29 DIAGNOSIS — M5116 Intervertebral disc disorders with radiculopathy, lumbar region: Secondary | ICD-10-CM | POA: Diagnosis not present

## 2022-01-29 DIAGNOSIS — I10 Essential (primary) hypertension: Secondary | ICD-10-CM | POA: Insufficient documentation

## 2022-01-29 DIAGNOSIS — M5126 Other intervertebral disc displacement, lumbar region: Secondary | ICD-10-CM | POA: Diagnosis present

## 2022-01-29 DIAGNOSIS — M5416 Radiculopathy, lumbar region: Secondary | ICD-10-CM

## 2022-01-29 DIAGNOSIS — Z01818 Encounter for other preprocedural examination: Secondary | ICD-10-CM

## 2022-01-29 HISTORY — PX: LUMBAR LAMINECTOMY/DECOMPRESSION MICRODISCECTOMY: SHX5026

## 2022-01-29 LAB — SURGICAL PCR SCREEN
MRSA, PCR: NEGATIVE
Staphylococcus aureus: NEGATIVE

## 2022-01-29 SURGERY — LUMBAR LAMINECTOMY/DECOMPRESSION MICRODISCECTOMY 1 LEVEL
Anesthesia: General | Site: Back | Laterality: Left

## 2022-01-29 MED ORDER — PROPOFOL 10 MG/ML IV BOLUS
INTRAVENOUS | Status: DC | PRN
  Administered 2022-01-29: 120 mg via INTRAVENOUS

## 2022-01-29 MED ORDER — HYDROCODONE-ACETAMINOPHEN 5-325 MG PO TABS
2.0000 | ORAL_TABLET | ORAL | Status: DC | PRN
  Administered 2022-01-29 – 2022-01-30 (×4): 2 via ORAL
  Filled 2022-01-29 (×4): qty 2

## 2022-01-29 MED ORDER — CEFAZOLIN SODIUM-DEXTROSE 2-4 GM/100ML-% IV SOLN
2.0000 g | Freq: Three times a day (TID) | INTRAVENOUS | Status: AC
  Administered 2022-01-29 – 2022-01-30 (×2): 2 g via INTRAVENOUS
  Filled 2022-01-29 (×2): qty 100

## 2022-01-29 MED ORDER — FENTANYL CITRATE (PF) 250 MCG/5ML IJ SOLN
INTRAMUSCULAR | Status: DC | PRN
  Administered 2022-01-29: 125 ug via INTRAVENOUS
  Administered 2022-01-29: 50 ug via INTRAVENOUS

## 2022-01-29 MED ORDER — MENTHOL 3 MG MT LOZG: 1.0000 | LOZENGE | OROMUCOSAL | Status: DC | PRN

## 2022-01-29 MED ORDER — ONDANSETRON HCL 4 MG PO TABS: 4.0000 mg | ORAL_TABLET | Freq: Four times a day (QID) | ORAL | Status: DC | PRN

## 2022-01-29 MED ORDER — OXYCODONE HCL 5 MG PO TABS
5.0000 mg | ORAL_TABLET | Freq: Once | ORAL | Status: AC | PRN
  Administered 2022-01-29: 5 mg via ORAL

## 2022-01-29 MED ORDER — ONDANSETRON HCL 4 MG/2ML IJ SOLN
INTRAMUSCULAR | Status: AC
  Filled 2022-01-29: qty 2

## 2022-01-29 MED ORDER — ROCURONIUM BROMIDE 10 MG/ML (PF) SYRINGE
PREFILLED_SYRINGE | INTRAVENOUS | Status: AC
  Filled 2022-01-29: qty 10

## 2022-01-29 MED ORDER — FENTANYL CITRATE (PF) 250 MCG/5ML IJ SOLN
INTRAMUSCULAR | Status: AC
  Filled 2022-01-29: qty 5

## 2022-01-29 MED ORDER — OXYCODONE HCL 5 MG PO TABS
ORAL_TABLET | ORAL | Status: AC
  Filled 2022-01-29: qty 1

## 2022-01-29 MED ORDER — LORATADINE 10 MG PO TABS
10.0000 mg | ORAL_TABLET | Freq: Every day | ORAL | Status: DC
  Administered 2022-01-30: 10 mg via ORAL
  Filled 2022-01-29: qty 1

## 2022-01-29 MED ORDER — ACETAMINOPHEN 10 MG/ML IV SOLN
1000.0000 mg | Freq: Once | INTRAVENOUS | Status: DC | PRN
  Administered 2022-01-29: 1000 mg via INTRAVENOUS

## 2022-01-29 MED ORDER — AMISULPRIDE (ANTIEMETIC) 5 MG/2ML IV SOLN: 10.0000 mg | Freq: Once | INTRAVENOUS | Status: DC | PRN

## 2022-01-29 MED ORDER — ACETAMINOPHEN 325 MG PO TABS: 650.0000 mg | ORAL_TABLET | ORAL | Status: DC | PRN

## 2022-01-29 MED ORDER — CEFAZOLIN SODIUM-DEXTROSE 2-4 GM/100ML-% IV SOLN
INTRAVENOUS | Status: AC
  Filled 2022-01-29: qty 100

## 2022-01-29 MED ORDER — PHENOL 1.4 % MT LIQD: 1.0000 | OROMUCOSAL | Status: DC | PRN

## 2022-01-29 MED ORDER — PANTOPRAZOLE SODIUM 40 MG IV SOLR: 40.0000 mg | Freq: Every day | INTRAVENOUS | Status: DC

## 2022-01-29 MED ORDER — ROCURONIUM BROMIDE 10 MG/ML (PF) SYRINGE
PREFILLED_SYRINGE | INTRAVENOUS | Status: DC | PRN
  Administered 2022-01-29: 70 mg via INTRAVENOUS

## 2022-01-29 MED ORDER — LACTATED RINGERS IV SOLN: INTRAVENOUS | Status: DC

## 2022-01-29 MED ORDER — EMERGEN-C VITAMIN C PO PACK: 1.0000 | PACK | Freq: Every day | ORAL | Status: DC

## 2022-01-29 MED ORDER — CYCLOBENZAPRINE HCL 10 MG PO TABS
10.0000 mg | ORAL_TABLET | Freq: Three times a day (TID) | ORAL | Status: DC | PRN
  Filled 2022-01-29: qty 1

## 2022-01-29 MED ORDER — FENTANYL CITRATE (PF) 100 MCG/2ML IJ SOLN
INTRAMUSCULAR | Status: AC
  Filled 2022-01-29: qty 2

## 2022-01-29 MED ORDER — ACETAMINOPHEN 500 MG PO TABS: 1000.0000 mg | ORAL_TABLET | Freq: Four times a day (QID) | ORAL | Status: DC | PRN

## 2022-01-29 MED ORDER — ONDANSETRON HCL 4 MG/2ML IJ SOLN: 4.0000 mg | Freq: Four times a day (QID) | INTRAMUSCULAR | Status: DC | PRN

## 2022-01-29 MED ORDER — ALUM & MAG HYDROXIDE-SIMETH 200-200-20 MG/5ML PO SUSP: 30.0000 mL | Freq: Four times a day (QID) | ORAL | Status: DC | PRN

## 2022-01-29 MED ORDER — PANTOPRAZOLE SODIUM 40 MG PO TBEC
40.0000 mg | DELAYED_RELEASE_TABLET | Freq: Every day | ORAL | Status: DC
  Administered 2022-01-29: 40 mg via ORAL
  Filled 2022-01-29: qty 1

## 2022-01-29 MED ORDER — SUGAMMADEX SODIUM 200 MG/2ML IV SOLN
INTRAVENOUS | Status: DC | PRN
  Administered 2022-01-29: 200 mg via INTRAVENOUS

## 2022-01-29 MED ORDER — THROMBIN (RECOMBINANT) 5000 UNITS EX SOLR
CUTANEOUS | Status: DC | PRN
  Administered 2022-01-29: 10 mL via TOPICAL

## 2022-01-29 MED ORDER — METHOCARBAMOL 500 MG PO TABS
500.0000 mg | ORAL_TABLET | Freq: Three times a day (TID) | ORAL | Status: DC
  Administered 2022-01-29 – 2022-01-30 (×2): 500 mg via ORAL
  Filled 2022-01-29 (×2): qty 1

## 2022-01-29 MED ORDER — CHLORHEXIDINE GLUCONATE 0.12 % MT SOLN
OROMUCOSAL | Status: AC
  Administered 2022-01-29: 15 mL via OROMUCOSAL
  Filled 2022-01-29: qty 15

## 2022-01-29 MED ORDER — ACETAMINOPHEN 500 MG PO TABS: 1000.0000 mg | ORAL_TABLET | Freq: Once | ORAL | Status: DC | PRN

## 2022-01-29 MED ORDER — SODIUM CHLORIDE 0.9% FLUSH: 3.0000 mL | INTRAVENOUS | Status: DC | PRN

## 2022-01-29 MED ORDER — LEVOCETIRIZINE DIHYDROCHLORIDE 5 MG PO TABS: 5.0000 mg | ORAL_TABLET | Freq: Every evening | ORAL | Status: DC

## 2022-01-29 MED ORDER — HYDROMORPHONE HCL 1 MG/ML IJ SOLN: 0.5000 mg | INTRAMUSCULAR | Status: DC | PRN

## 2022-01-29 MED ORDER — SODIUM CHLORIDE 0.9% FLUSH: 3.0000 mL | Freq: Two times a day (BID) | INTRAVENOUS | Status: DC

## 2022-01-29 MED ORDER — ORAL CARE MOUTH RINSE: 15.0000 mL | Freq: Once | OROMUCOSAL | Status: AC

## 2022-01-29 MED ORDER — LIDOCAINE-EPINEPHRINE 1 %-1:100000 IJ SOLN
INTRAMUSCULAR | Status: DC | PRN
  Administered 2022-01-29: 10 mL

## 2022-01-29 MED ORDER — 0.9 % SODIUM CHLORIDE (POUR BTL) OPTIME
TOPICAL | Status: DC | PRN
  Administered 2022-01-29: 1000 mL

## 2022-01-29 MED ORDER — OXYCODONE HCL 5 MG/5ML PO SOLN: 5.0000 mg | Freq: Once | ORAL | Status: AC | PRN

## 2022-01-29 MED ORDER — DEXAMETHASONE SODIUM PHOSPHATE 10 MG/ML IJ SOLN
INTRAMUSCULAR | Status: DC | PRN
  Administered 2022-01-29: 10 mg via INTRAVENOUS

## 2022-01-29 MED ORDER — ONDANSETRON HCL 4 MG/2ML IJ SOLN
INTRAMUSCULAR | Status: DC | PRN
  Administered 2022-01-29: 4 mg via INTRAVENOUS

## 2022-01-29 MED ORDER — BUPIVACAINE HCL (PF) 0.25 % IJ SOLN
INTRAMUSCULAR | Status: DC | PRN
  Administered 2022-01-29: 10 mL

## 2022-01-29 MED ORDER — ACETAMINOPHEN 160 MG/5ML PO SOLN: 1000.0000 mg | Freq: Once | ORAL | Status: DC | PRN

## 2022-01-29 MED ORDER — CHLORHEXIDINE GLUCONATE CLOTH 2 % EX PADS: 6.0000 | MEDICATED_PAD | Freq: Once | CUTANEOUS | Status: DC

## 2022-01-29 MED ORDER — LIDOCAINE 2% (20 MG/ML) 5 ML SYRINGE
INTRAMUSCULAR | Status: DC | PRN
  Administered 2022-01-29: 60 mg via INTRAVENOUS

## 2022-01-29 MED ORDER — DEXAMETHASONE SODIUM PHOSPHATE 10 MG/ML IJ SOLN
INTRAMUSCULAR | Status: AC
  Filled 2022-01-29: qty 1

## 2022-01-29 MED ORDER — BUPIVACAINE HCL (PF) 0.25 % IJ SOLN
INTRAMUSCULAR | Status: AC
  Filled 2022-01-29: qty 30

## 2022-01-29 MED ORDER — LIDOCAINE 2% (20 MG/ML) 5 ML SYRINGE
INTRAMUSCULAR | Status: AC
  Filled 2022-01-29: qty 5

## 2022-01-29 MED ORDER — DICLOFENAC SODIUM 1 % EX GEL: 2.0000 g | Freq: Every day | CUTANEOUS | Status: DC | PRN

## 2022-01-29 MED ORDER — PHENYLEPHRINE 80 MCG/ML (10ML) SYRINGE FOR IV PUSH (FOR BLOOD PRESSURE SUPPORT)
PREFILLED_SYRINGE | INTRAVENOUS | Status: DC | PRN
  Administered 2022-01-29 (×3): 160 ug via INTRAVENOUS

## 2022-01-29 MED ORDER — CHLORHEXIDINE GLUCONATE 0.12 % MT SOLN: 15.0000 mL | Freq: Once | OROMUCOSAL | Status: AC

## 2022-01-29 MED ORDER — SODIUM CHLORIDE 0.9 % IV SOLN
250.0000 mL | INTRAVENOUS | Status: DC
  Administered 2022-01-29: 250 mL via INTRAVENOUS

## 2022-01-29 MED ORDER — SACCHAROMYCES BOULARDII 250 MG PO CAPS
250.0000 mg | ORAL_CAPSULE | Freq: Every day | ORAL | Status: DC
  Administered 2022-01-30: 250 mg via ORAL
  Filled 2022-01-29: qty 1

## 2022-01-29 MED ORDER — OXYCODONE-ACETAMINOPHEN 5-325 MG PO TABS: 1.0000 | ORAL_TABLET | Freq: Four times a day (QID) | ORAL | Status: DC | PRN

## 2022-01-29 MED ORDER — ACETAMINOPHEN 650 MG RE SUPP: 650.0000 mg | RECTAL | Status: DC | PRN

## 2022-01-29 MED ORDER — LIDOCAINE-EPINEPHRINE 1 %-1:100000 IJ SOLN
INTRAMUSCULAR | Status: AC
  Filled 2022-01-29: qty 1

## 2022-01-29 MED ORDER — HYDROCODONE-ACETAMINOPHEN 5-325 MG PO TABS: 1.0000 | ORAL_TABLET | Freq: Four times a day (QID) | ORAL | Status: DC | PRN

## 2022-01-29 MED ORDER — FENTANYL CITRATE (PF) 100 MCG/2ML IJ SOLN
25.0000 ug | INTRAMUSCULAR | Status: DC | PRN
  Administered 2022-01-29 (×3): 50 ug via INTRAVENOUS

## 2022-01-29 MED ORDER — CEFAZOLIN SODIUM-DEXTROSE 2-4 GM/100ML-% IV SOLN
2.0000 g | INTRAVENOUS | Status: AC
  Administered 2022-01-29: 2 g via INTRAVENOUS

## 2022-01-29 MED ORDER — MIDAZOLAM HCL 2 MG/2ML IJ SOLN
INTRAMUSCULAR | Status: AC
  Filled 2022-01-29: qty 2

## 2022-01-29 MED ORDER — PHENYLEPHRINE HCL-NACL 20-0.9 MG/250ML-% IV SOLN
INTRAVENOUS | Status: DC | PRN
  Administered 2022-01-29: 50 ug/min via INTRAVENOUS

## 2022-01-29 MED ORDER — MIDAZOLAM HCL 2 MG/2ML IJ SOLN
INTRAMUSCULAR | Status: DC | PRN
  Administered 2022-01-29: 2 mg via INTRAVENOUS

## 2022-01-29 MED ORDER — THROMBIN 5000 UNITS EX SOLR
CUTANEOUS | Status: AC
  Filled 2022-01-29: qty 10000

## 2022-01-29 SURGICAL SUPPLY — 49 items
BAG COUNTER SPONGE SURGICOUNT (BAG) ×1 IMPLANT
BAND RUBBER #18 3X1/16 STRL (MISCELLANEOUS) ×2 IMPLANT
BENZOIN TINCTURE PRP APPL 2/3 (GAUZE/BANDAGES/DRESSINGS) ×1 IMPLANT
BLADE CLIPPER SURG (BLADE) IMPLANT
BLADE SURG 11 STRL SS (BLADE) ×1 IMPLANT
BUR CUTTER 7.0 ROUND (BURR) ×1 IMPLANT
BUR MATCHSTICK NEURO 3.0 LAGG (BURR) ×1 IMPLANT
CANISTER SUCT 3000ML PPV (MISCELLANEOUS) ×1 IMPLANT
DERMABOND ADVANCED .7 DNX12 (GAUZE/BANDAGES/DRESSINGS) ×1 IMPLANT
DRAPE HALF SHEET 40X57 (DRAPES) IMPLANT
DRAPE LAPAROTOMY 100X72X124 (DRAPES) ×1 IMPLANT
DRAPE MICROSCOPE SLANT 54X150 (MISCELLANEOUS) ×1 IMPLANT
DRAPE SURG 17X23 STRL (DRAPES) ×1 IMPLANT
DRSG OPSITE POSTOP 3X4 (GAUZE/BANDAGES/DRESSINGS) IMPLANT
DURAPREP 26ML APPLICATOR (WOUND CARE) ×1 IMPLANT
ELECT REM PT RETURN 9FT ADLT (ELECTROSURGICAL) ×1
ELECTRODE REM PT RTRN 9FT ADLT (ELECTROSURGICAL) ×1 IMPLANT
GAUZE 4X4 16PLY ~~LOC~~+RFID DBL (SPONGE) IMPLANT
GAUZE SPONGE 4X4 12PLY STRL (GAUZE/BANDAGES/DRESSINGS) ×1 IMPLANT
GLOVE BIO SURGEON STRL SZ7 (GLOVE) IMPLANT
GLOVE BIO SURGEON STRL SZ8 (GLOVE) ×1 IMPLANT
GLOVE BIOGEL PI IND STRL 7.0 (GLOVE) IMPLANT
GLOVE BIOGEL PI IND STRL 7.5 (GLOVE) IMPLANT
GLOVE ECLIPSE 7.0 STRL STRAW (GLOVE) IMPLANT
GLOVE EXAM NITRILE XL STR (GLOVE) IMPLANT
GLOVE INDICATOR 8.5 STRL (GLOVE) ×2 IMPLANT
GOWN STRL REUS W/ TWL LRG LVL3 (GOWN DISPOSABLE) ×1 IMPLANT
GOWN STRL REUS W/ TWL XL LVL3 (GOWN DISPOSABLE) ×2 IMPLANT
GOWN STRL REUS W/TWL 2XL LVL3 (GOWN DISPOSABLE) IMPLANT
GOWN STRL REUS W/TWL LRG LVL3 (GOWN DISPOSABLE) ×3
GOWN STRL REUS W/TWL XL LVL3 (GOWN DISPOSABLE) ×2
KIT BASIN OR (CUSTOM PROCEDURE TRAY) ×1 IMPLANT
KIT TURNOVER KIT B (KITS) ×1 IMPLANT
NDL SPNL 22GX3.5 QUINCKE BK (NEEDLE) ×1 IMPLANT
NEEDLE HYPO 22GX1.5 SAFETY (NEEDLE) ×1 IMPLANT
NEEDLE SPNL 22GX3.5 QUINCKE BK (NEEDLE) ×1 IMPLANT
NS IRRIG 1000ML POUR BTL (IV SOLUTION) ×1 IMPLANT
PACK LAMINECTOMY NEURO (CUSTOM PROCEDURE TRAY) ×1 IMPLANT
SPIKE FLUID TRANSFER (MISCELLANEOUS) ×1 IMPLANT
SPONGE SURGIFOAM ABS GEL SZ50 (HEMOSTASIS) ×1 IMPLANT
STRIP CLOSURE SKIN 1/2X4 (GAUZE/BANDAGES/DRESSINGS) ×1 IMPLANT
SUT VIC AB 0 CT1 18XCR BRD8 (SUTURE) ×1 IMPLANT
SUT VIC AB 0 CT1 8-18 (SUTURE) ×1
SUT VIC AB 2-0 CT1 18 (SUTURE) ×1 IMPLANT
SUT VIC AB 4-0 PS2 27 (SUTURE) IMPLANT
SUT VICRYL 4-0 PS2 18IN ABS (SUTURE) ×1 IMPLANT
TOWEL GREEN STERILE (TOWEL DISPOSABLE) ×1 IMPLANT
TOWEL GREEN STERILE FF (TOWEL DISPOSABLE) ×1 IMPLANT
WATER STERILE IRR 1000ML POUR (IV SOLUTION) ×1 IMPLANT

## 2022-01-29 NOTE — Op Note (Signed)
Preoperative diagnosis: Left L3 radiculopathy from herniated nucleus pulposus extraforaminal L3-4 left.  Postoperative diagnosis: Same.  Procedure: Extraforaminal microdiscectomy L3-4 and the left with microdissection of the left L3 nerve root microscopic discectomy.  Surgeon: Kary Kos.  Assistant: Duffy Rhody.  Anesthesia: General.  EBL: Minimal.  HPI: 61 year old female progressive worsening back and left hip and anterior quad pain consistent with an L3 nerve root pattern workup revealed a large extraforaminal disc herniation L3-4 on the left.  Due to patient's progression of clinical syndrome imaging findings of a conservative treatment I recommended extraforaminal discectomy at L3-4 on the left.  I extensively reviewed the risks and benefits of the operation with the patient as well as perioperative course expectations of outcome and alternatives of surgery and she understood and agreed to proceed forward.  Operative procedure: Patient brought into the OR was induced under general anesthesia positioned prone the Wilson frame her back was prepped and draped in routine sterile fashion.  Preoperative x-ray localized the appropriate level so after infiltration of 10 cc lidocaine with epi midline incision was made and Bovie electrocautery was used to take down the subcutaneous tissue and subperiosteal dissection was carried lamina of L2-L3 and L4 identified the transverse process of L3 and drilled off the inferior aspect of the 2 3 facet lateral pars and superior aspect of the 3 4 facet.  Then under bit the lateral pars and the superior aspect of 3 4 facet identified the intertransverse ligament remove that in piecemeal fashion.  At this point the operating microscope was draped and brought into the field under microscopic lamination identified the L3 nerve root and a very large disc herniation immediately underneath the L3 nerve root.  Incised the annulus cleaned out several large fibers disc  excess but expanded and explored proximally distally medially and laterally no further fragments were appreciated the wound was then copiously irrigated meticulous hemostasis was maintained Gelfoam was ON top of the dura and the muscle and fascia were reapproximated in layers with interrupted Vicryl and the skin was closed with a running 4 subcuticular.  Dermabond benzoin Steri-Strips and a sterile dressing was applied patient recovery in stable condition.  At the end the case all needle counts and sponge counts were correct.

## 2022-01-29 NOTE — Anesthesia Procedure Notes (Signed)
Procedure Name: Intubation Date/Time: 01/29/2022 3:44 PM  Performed by: Inda Coke, CRNAPre-anesthesia Checklist: Patient identified, Emergency Drugs available, Suction available, Timeout performed and Patient being monitored Patient Re-evaluated:Patient Re-evaluated prior to induction Oxygen Delivery Method: Circle system utilized Preoxygenation: Pre-oxygenation with 100% oxygen Induction Type: IV induction Ventilation: Mask ventilation without difficulty Laryngoscope Size: Mac and 3 Grade View: Grade I Tube type: Oral Tube size: 7.0 mm Airway Equipment and Method: Stylet Placement Confirmation: ETT inserted through vocal cords under direct vision, positive ETCO2, CO2 detector and breath sounds checked- equal and bilateral Secured at: 22 cm Tube secured with: Tape Dental Injury: Teeth and Oropharynx as per pre-operative assessment

## 2022-01-29 NOTE — H&P (Signed)
Julie Mcdonald is an 61 y.o. female.   Chief Complaint: Back and left leg pain HPI: 61 year old female with back and left leg pain rating down L3 nerve root pattern.  Workup revealed large disc herniation extraforaminal at L3-4 on the left.  Due to patient's progression of clinical syndrome imaging findings of a conservative treatment I recommended an extraforaminal discectomy at that level.  I extensively reviewed the risks and benefits of the operation with her as well as perioperative course expectations of outcome and alternatives of surgery and she understood and agreed to proceed forward.  Past Medical History:  Diagnosis Date   Allergy    Anemia    Anxiety    Arthritis    GERD (gastroesophageal reflux disease)    Hx of adenomatous polyp of colon 11/28/2020   Hypertension    Palpitations    CTA 2020 normal no obstruction   Seasonal allergies    Wears glasses     Past Surgical History:  Procedure Laterality Date   BREAST LUMPECTOMY WITH NEEDLE LOCALIZATION  01/23/2012   Procedure: BREAST LUMPECTOMY WITH NEEDLE LOCALIZATION;  Surgeon: Merrie Roof, MD;  Location: [facility];  Service: General;  Laterality: Right;   BREAST SURGERY  02/20/1988   right breast removed cyst   COLONOSCOPY     last 1022 w/ 1 adenoma   TUBAL LIGATION      Family History  Problem Relation Age of Onset   Heart disease Mother    Hypertension Mother    Breast cancer Maternal Aunt 70   Diabetes Maternal Grandmother    Heart disease Maternal Grandmother    Cancer Maternal Grandmother        colon   Diabetes Maternal Grandfather    Heart disease Maternal Grandfather    Cancer Maternal Grandfather    Colon cancer Maternal Great-grandmother    Esophageal cancer Neg Hx    Colon polyps Neg Hx    Rectal cancer Neg Hx    Stomach cancer Neg Hx    Social History:  reports that she has never smoked. She has never used smokeless tobacco. She reports that she does not currently use  alcohol. She reports that she does not use drugs.  Allergies: No Active Allergies  Facility-Administered Medications Prior to Admission  Medication Dose Route Frequency Provider Last Rate Last Admin   0.9 %  sodium chloride infusion  500 mL Intravenous Once Gatha Mayer, MD       Medications Prior to Admission  Medication Sig Dispense Refill   acetaminophen (TYLENOL) 500 MG tablet Take 1,000 mg by mouth every 6 (six) hours as needed for moderate pain.     cetirizine (ZYRTEC) 10 MG tablet Take 10 mg by mouth daily.     diclofenac Sodium (VOLTAREN ARTHRITIS PAIN) 1 % GEL Apply 2 g topically daily as needed (back pain).     HYDROcodone-acetaminophen (NORCO/VICODIN) 5-325 MG tablet Take 1 tablet by mouth every 6 (six) hours as needed for moderate pain.     levocetirizine (XYZAL) 5 MG tablet Take 5 mg by mouth every evening.     methocarbamol (ROBAXIN) 500 MG tablet Take 1 tablet (500 mg total) by mouth 2 (two) times daily. (Patient taking differently: Take 500 mg by mouth 3 (three) times daily.) 20 tablet 0   Multiple Vitamins-Minerals (EMERGEN-C VITAMIN C) PACK Take 1 Package by mouth daily.     Probiotic Product (PROBIOTIC DAILY PO) Take 1 tablet by mouth daily.  oxyCODONE-acetaminophen (PERCOCET) 5-325 MG tablet Take 1-2 tablets by mouth every 6 (six) hours as needed. (Patient not taking: Reported on 01/03/2022) 15 tablet 0   predniSONE (DELTASONE) 20 MG tablet 3 Tabs PO Days 1-3, then 2 tabs PO Days 4-6, then 1 tab PO Day 7-9, then Half Tab PO Day 10-12 (Patient not taking: Reported on 01/03/2022) 20 tablet 0    Results for orders placed or performed during the hospital encounter of 01/29/22 (from the past 48 hour(s))  Surgical pcr screen     Status: None   Collection Time: 01/29/22 11:00 AM   Specimen: Nasal Mucosa; Nasal Swab  Result Value Ref Range   MRSA, PCR NEGATIVE NEGATIVE   Staphylococcus aureus NEGATIVE NEGATIVE    Comment: (NOTE) The Xpert SA Assay (FDA approved for  NASAL specimens in patients 59 years of age and older), is one component of a comprehensive surveillance program. It is not intended to diagnose infection nor to guide or monitor treatment. Performed at [facility], [place] [address], [place], [place] [address]    No results found.  Review of Systems  Musculoskeletal:  Positive for back pain.  Neurological:  Positive for numbness.    Blood pressure (!) 155/82, pulse 85, temperature (!) 97.4 F (36.3 C), temperature source Oral, resp. rate 18, height '5\' 6"'$  (1.676 m), weight 69.4 kg, last menstrual period 03/22/2012, SpO2 100 %. Physical Exam HENT:     Head: Normocephalic.     Right Ear: Tympanic membrane normal.     Nose: Nose normal.     Mouth/Throat:     Mouth: Mucous membranes are moist.  Eyes:     Pupils: Pupils are equal, round, and reactive to light.  Cardiovascular:     Rate and Rhythm: Normal rate.  Pulmonary:     Effort: Pulmonary effort is normal.  Abdominal:     General: Abdomen is flat.  Musculoskeletal:        General: Normal range of motion.  Skin:    General: Skin is warm.  Neurological:     Mental Status: She is alert.     Comments: Strength is 5 out of 5 iliopsoas, quads, hamstrings, gastroc, into tibialis, and EHL in the right lower extremity left lower extremity has a little bit of weakness in her quads at 4+ out of 5 otherwise 5 out of 5      Assessment/Plan 61 year old presents for extraforaminal discectomy L3-4 on the left.  Elaina Hoops, MD 01/29/2022, 2:58 PM

## 2022-01-29 NOTE — Transfer of Care (Signed)
Immediate Anesthesia Transfer of Care Note  Patient: Julie Mcdonald  Procedure(s) Performed: Left - Lumbar Three-Four extraforaminal microdiskectomy (Left: Back)  Patient Location: PACU  Anesthesia Type:General  Level of Consciousness: awake and alert   Airway & Oxygen Therapy: Patient Spontanous Breathing and Patient connected to face mask oxygen  Post-op Assessment: Report given to RN and Post -op Vital signs reviewed and stable  Post vital signs: Reviewed and stable  Last Vitals:  Vitals Value Taken Time  BP 147/78 01/29/22 1708  Temp 36.4 C 01/29/22 1708  Pulse 81 01/29/22 1715  Resp 15 01/29/22 1715  SpO2 92 % 01/29/22 1715  Vitals shown include unvalidated device data.  Last Pain:  Vitals:   01/29/22 1106  TempSrc:   PainSc: 6          Complications: No notable events documented.

## 2022-01-30 DIAGNOSIS — M5116 Intervertebral disc disorders with radiculopathy, lumbar region: Secondary | ICD-10-CM | POA: Diagnosis not present

## 2022-01-30 MED ORDER — METHOCARBAMOL 500 MG PO TABS: 500.0000 mg | ORAL_TABLET | Freq: Two times a day (BID) | ORAL | 0 refills | Status: DC

## 2022-01-30 MED ORDER — HYDROCODONE-ACETAMINOPHEN 5-325 MG PO TABS: 2.0000 | ORAL_TABLET | ORAL | 0 refills | Status: DC | PRN

## 2022-01-30 MED FILL — Thrombin For Soln 5000 Unit: CUTANEOUS | Qty: 2 | Status: AC

## 2022-01-30 NOTE — Plan of Care (Signed)
  Problem: Education: Goal: Ability to verbalize activity precautions or restrictions will improve Outcome: Completed/Met Goal: Knowledge of the prescribed therapeutic regimen will improve Outcome: Completed/Met Goal: Understanding of discharge needs will improve Outcome: Completed/Met   

## 2022-01-30 NOTE — Plan of Care (Signed)
Patient alert and oriented, void, ambulate. Surgical site clean and dry no sign of infections. D/c instructions explain and given to the patient. All questions answered. Pt. D/c home per order.

## 2022-01-30 NOTE — Anesthesia Postprocedure Evaluation (Signed)
Anesthesia Post Note  Patient: Julie Mcdonald  Procedure(s) Performed: Left - Lumbar Three-Four extraforaminal microdiskectomy (Left: Back)     Patient location during evaluation: PACU Anesthesia Type: General Level of consciousness: awake and alert Pain management: pain level controlled Vital Signs Assessment: post-procedure vital signs reviewed and stable Respiratory status: spontaneous breathing, nonlabored ventilation, respiratory function stable and patient connected to nasal cannula oxygen Cardiovascular status: blood pressure returned to baseline and stable Postop Assessment: no apparent nausea or vomiting Anesthetic complications: no  No notable events documented.  Last Vitals:  Vitals:   01/30/22 0409 01/30/22 0810  BP: 128/70 (!) 144/75  Pulse: 82 85  Resp: 18 16  Temp: 36.9 C 36.9 C  SpO2: 98% 100%    Last Pain:  Vitals:   01/30/22 0858  TempSrc:   PainSc: 5                  Aylana Hirschfeld

## 2022-01-30 NOTE — Discharge Summary (Signed)
Physician Discharge Summary  Patient ID: Julie Mcdonald MRN: [id number] DOB/AGE: Jan 17, 1961 61 y.o.  Admit date: 01/29/2022 Discharge date: 01/30/2022  Admission Diagnoses: Left L3 radiculopathy from herniated nucleus pulposus extraforaminal L3-4 left.     Discharge Diagnoses: same   Discharged Condition: good  Hospital Course: The patient was admitted on 01/29/2022 and taken to the operating room where the patient underwent microdisk L3-4. The patient tolerated the procedure well and was taken to the recovery room and then to the floor in stable condition. The hospital course was routine. There were no complications. The wound remained clean dry and intact. Pt had appropriate back soreness. No complaints of leg pain or new N/T/W. The patient remained afebrile with stable vital signs, and tolerated a regular diet. The patient continued to increase activities, and pain was well controlled with oral pain medications.   Consults: None  Significant Diagnostic Studies:  Results for orders placed or performed during the hospital encounter of 01/29/22  Surgical pcr screen   Specimen: Nasal Mucosa; Nasal Swab  Result Value Ref Range   MRSA, PCR NEGATIVE NEGATIVE   Staphylococcus aureus NEGATIVE NEGATIVE    DG Lumbar Spine 2-3 Views  Result Date: 01/29/2022 CLINICAL DATA:  Elective surgery. L3-4 extraforaminal microdiskectomy. EXAM: LUMBAR SPINE - 2-3 VIEW COMPARISON:  Lumbar spine radiographs 12/01/2021 FINDINGS: Two lateral views of the lumbar spine. There is moderate L5-S1 disc space narrowing. On the first image a needle overlies the posterior superior aspect of the L3 spinous process. On the second image, surgical instrumentation overlies the L4 spinous process and points towards the L4 pedicles. IMPRESSION: Surgical localization as above. Electronically Signed   By: Yvonne Kendall M.D.   On: 01/29/2022 17:41    Antibiotics:  Anti-infectives (From admission, onward)    Start      Dose/Rate Route Frequency Ordered Stop   01/29/22 2330  ceFAZolin (ANCEF) IVPB 2g/100 mL premix        2 g 200 mL/hr over 30 Minutes Intravenous Every 8 hours 01/29/22 1836 01/30/22 0604   01/29/22 1045  ceFAZolin (ANCEF) IVPB 2g/100 mL premix        2 g 200 mL/hr over 30 Minutes Intravenous On call to O.R. 01/29/22 1040 01/29/22 1551   01/29/22 1045  ceFAZolin (ANCEF) 2-4 GM/100ML-% IVPB       Note to Pharmacy: Roosvelt Maser N: cabinet override      01/29/22 1045 01/29/22 1547       Discharge Exam: Blood pressure 128/70, pulse 82, temperature 98.5 F (36.9 C), temperature source Oral, resp. rate 18, height '5\' 6"'$  (1.676 m), weight 69.4 kg, last menstrual period 03/22/2012, SpO2 98 %. Neurologic: Grossly normal Ambulating and voiding well   Discharge Medications:   Allergies as of 01/30/2022   No Active Allergies      Medication List     STOP taking these medications    oxyCODONE-acetaminophen 5-325 MG tablet Commonly known as: Percocet       TAKE these medications    acetaminophen 500 MG tablet Commonly known as: TYLENOL Take 1,000 mg by mouth every 6 (six) hours as needed for moderate pain.   cetirizine 10 MG tablet Commonly known as: ZYRTEC Take 10 mg by mouth daily.   Emergen-C Vitamin C Pack Take 1 Package by mouth daily.   HYDROcodone-acetaminophen 5-325 MG tablet Commonly known as: NORCO/VICODIN Take 2 tablets by mouth every 4 (four) hours as needed for severe pain ((score 7 to 10)). What changed:  how much to  take when to take this reasons to take this   levocetirizine 5 MG tablet Commonly known as: XYZAL Take 5 mg by mouth every evening.   methocarbamol 500 MG tablet Commonly known as: ROBAXIN Take 1 tablet (500 mg total) by mouth 2 (two) times daily. What changed: when to take this   predniSONE 20 MG tablet Commonly known as: DELTASONE 3 Tabs PO Days 1-3, then 2 tabs PO Days 4-6, then 1 tab PO Day 7-9, then Half Tab PO Day 10-12    PROBIOTIC DAILY PO Take 1 tablet by mouth daily.   Voltaren Arthritis Pain 1 % Gel Generic drug: diclofenac Sodium Apply 2 g topically daily as needed (back pain).        Disposition: home   Final Dx:  microdisk L3-4  Discharge Instructions      Remove dressing in 72 hours   Complete by: As directed    Call MD for:  difficulty breathing, headache or visual disturbances   Complete by: As directed    Call MD for:  extreme fatigue   Complete by: As directed    Call MD for:  hives   Complete by: As directed    Call MD for:  persistant dizziness or light-headedness   Complete by: As directed    Call MD for:  persistant nausea and vomiting   Complete by: As directed    Call MD for:  redness, tenderness, or signs of infection (pain, swelling, redness, odor or green/yellow discharge around incision site)   Complete by: As directed    Call MD for:  severe uncontrolled pain   Complete by: As directed    Diet - low sodium heart healthy   Complete by: As directed    Increase activity slowly   Complete by: As directed           Signed: Ocie Cornfield Dannel Rafter 01/30/2022, 7:50 AM

## 2022-01-31 ENCOUNTER — Telehealth: Payer: Self-pay

## 2022-01-31 ENCOUNTER — Encounter (HOSPITAL_COMMUNITY): Payer: Self-pay | Admitting: Neurosurgery

## 2022-01-31 NOTE — Telephone Encounter (Signed)
Transition Care Management Unsuccessful Follow-up Telephone Call  Date of discharge and from where:  01/30/22 [facility] inpatient. Sx: Left lumbar extraforaminal microdiskectomy  Attempts:  1st Attempt  Reason for unsuccessful TCM follow-up call:  Left voice message

## 2022-02-01 ENCOUNTER — Ambulatory Visit: Payer: Commercial Managed Care - HMO | Admitting: Neurology

## 2022-02-01 NOTE — Telephone Encounter (Signed)
Transition Care Management Follow-up Telephone Call Date of discharge and from where: 01/30/22 [facility] Inpatient. Sx: Sx: Left lumbar extraforaminal microdiskectomy  How have you been since you were released from the hospital? I'm doping ok Any questions or concerns? No  Items Reviewed: Did the pt receive and understand the discharge instructions provided? Yes  Medications obtained and verified? Yes  Other? No  Any new allergies since your discharge? No  Dietary orders reviewed? No Do you have support at home? Yes   Home Care and Equipment/Supplies: Were home health services ordered? not applicable If so, what is the name of the agency? N/a  Has the agency set up a time to come to the patient's home? not applicable Were any new equipment or medical supplies ordered?  No What is the name of the medical supply agency? N/a Were you able to get the supplies/equipment? not applicable Do you have any questions related to the use of the equipment or supplies? No  Functional Questionnaire: (I = Independent and D = Dependent) ADLs: I  Bathing/Dressing- I  Meal Prep- I  Eating- I  Maintaining continence- I  Transferring/Ambulation- I  Managing Meds- I  Follow up appointments reviewed:  PCP Hospital f/u appt confirmed? Yes  Scheduled to see Wilfred Lacy on 02/08/22 @ 11:20am. [facility] f/u appt confirmed? No  Scheduled to see n/a on n/a @ n/a. Are transportation arrangements needed? No  If their condition worsens, is the pt aware to call PCP or go to the Emergency Dept.? Yes Was the patient provided with contact information for the PCP's office or ED? Yes Was to pt encouraged to call back with questions or concerns? Yes Angeline Slim, RN, BSN Rn Clinical Supervisor [facility] [employer]

## 2022-02-08 ENCOUNTER — Ambulatory Visit (INDEPENDENT_AMBULATORY_CARE_PROVIDER_SITE_OTHER): Payer: Commercial Managed Care - HMO | Admitting: Nurse Practitioner

## 2022-02-08 ENCOUNTER — Encounter: Payer: Self-pay | Admitting: Nurse Practitioner

## 2022-02-08 VITALS — BP 150/70 | HR 82 | Temp 96.0°F | Ht 66.0 in | Wt 160.2 lb

## 2022-02-08 DIAGNOSIS — I10 Essential (primary) hypertension: Secondary | ICD-10-CM | POA: Diagnosis not present

## 2022-02-08 DIAGNOSIS — F411 Generalized anxiety disorder: Secondary | ICD-10-CM | POA: Diagnosis not present

## 2022-02-08 DIAGNOSIS — Z9889 Other specified postprocedural states: Secondary | ICD-10-CM | POA: Diagnosis not present

## 2022-02-08 MED ORDER — LOSARTAN POTASSIUM 25 MG PO TABS: 25.0000 mg | ORAL_TABLET | Freq: Every day | ORAL | 5 refills | Status: DC

## 2022-02-08 NOTE — Progress Notes (Signed)
Established Patient Visit  Patient: Julie Mcdonald   DOB: 1960-06-12   61 y.o. Female  MRN: [id number] Visit Date: 02/08/2022  Subjective:    Chief Complaint  Patient presents with   Hospitalization Follow-up    No concerns. Going to surgeon's office today 1:45pm.   HPI HTN (hypertension) Home SBP 140s-150s Asymptomatic BP Readings from Last 3 Encounters:  02/08/22 (!) 150/70  01/30/22 (!) 144/75  01/03/22 (!) 150/80    Start losartan '25mg'$  daily Advised about importance of DASH diet F/up in 54month S/P diskectomy F/up appt with surgery today at 1:45pm  Reports persistent left leg pain.  GAD (generalized anxiety disorder) Ongoing for years, worse in last 132monthue to back pain. Associated with insomnia and racing thoughts. No SI/HI or hallucinations.  She agreed to psychology referral. Advised to try OTC Ashwaghanda+magnesium supplement F/up in 26m76montheviewed medical, surgical, and social history today  Medications: Outpatient Medications Prior to Visit  Medication Sig   acetaminophen (TYLENOL) 500 MG tablet Take 1,000 mg by mouth every 6 (six) hours as needed for moderate pain.   cetirizine (ZYRTEC) 10 MG tablet Take 10 mg by mouth daily.   HYDROcodone-acetaminophen (NORCO/VICODIN) 5-325 MG tablet Take 2 tablets by mouth every 4 (four) hours as needed for severe pain ((score 7 to 10)).   levocetirizine (XYZAL) 5 MG tablet Take 5 mg by mouth every evening.   methocarbamol (ROBAXIN) 500 MG tablet Take 1 tablet (500 mg total) by mouth 2 (two) times daily.   Multiple Vitamins-Minerals (EMERGEN-C VITAMIN C) PACK Take 1 Package by mouth daily.   Probiotic Product (PROBIOTIC DAILY PO) Take 1 tablet by mouth daily.   [DISCONTINUED] diclofenac Sodium (VOLTAREN ARTHRITIS PAIN) 1 % GEL Apply 2 g topically daily as needed (back pain). (Patient not taking: Reported on 02/08/2022)   [DISCONTINUED] predniSONE (DELTASONE) 20 MG tablet 3 Tabs PO Days 1-3, then 2  tabs PO Days 4-6, then 1 tab PO Day 7-9, then Half Tab PO Day 10-12 (Patient not taking: Reported on 01/03/2022)   No facility-administered medications prior to visit.   Reviewed past medical and social history.   ROS per HPI above  Last CBC Lab Results  Component Value Date   WBC 5.8 01/22/2022   HGB 11.7 (L) 01/22/2022   HCT 36.7 01/22/2022   MCV 82.8 01/22/2022   MCH 26.4 01/22/2022   RDW 13.9 01/22/2022   PLT 272 12/76/73/4193Last metabolic panel Lab Results  Component Value Date   GLUCOSE 90 01/22/2022   NA 144 01/22/2022   K 3.5 01/22/2022   CL 108 01/22/2022   CO2 28 01/22/2022   BUN 10 01/22/2022   CREATININE 0.72 01/22/2022   GFRNONAA >60 01/22/2022   CALCIUM 9.5 01/22/2022   PROT 7.5 06/04/2014   ALBUMIN 4.1 06/04/2014   BILITOT 0.6 06/04/2014   ALKPHOS 91 06/04/2014   AST 20 06/04/2014   ALT 15 06/04/2014   ANIONGAP 8 01/22/2022        Objective:  BP (!) 150/70   Pulse 82   Temp (!) 96 F (35.6 C) (Temporal)   Ht '5\' 6"'$  (1.676 m)   Wt 160 lb 3.2 oz (72.7 kg)   LMP 03/22/2012   SpO2 94%   BMI 25.86 kg/m      Physical Exam Neurological:     Mental Status: She is alert.  Psychiatric:  Attention and Perception: Attention normal.        Mood and Affect: Mood is anxious. Affect is tearful.        Speech: Speech normal.        Behavior: Behavior is cooperative.        Thought Content: Thought content normal.        Cognition and Memory: Cognition and memory normal.        Judgment: Judgment normal.     No results found for any visits on 02/08/22.    Assessment & Plan:    Problem List Items Addressed This Visit       Cardiovascular and Mediastinum   HTN (hypertension) - Primary    Home SBP 140s-150s Asymptomatic BP Readings from Last 3 Encounters:  02/08/22 (!) 150/70  01/30/22 (!) 144/75  01/03/22 (!) 150/80    Start losartan '25mg'$  daily Advised about importance of DASH diet F/up in 736month     Relevant Medications    losartan (COZAAR) 25 MG tablet     Other   GAD (generalized anxiety disorder)    Ongoing for years, worse in last 182monthue to back pain. Associated with insomnia and racing thoughts. No SI/HI or hallucinations.  She agreed to psychology referral. Advised to try OTC Ashwaghanda+magnesium supplement F/up in 36m53month   Relevant Orders   Ambulatory referral to Psychology   S/P diskectomy    F/up appt with surgery today at 1:45pm  Reports persistent left leg pain.      Return in about 4 weeks (around 03/08/2022) for HTN, depression and anxiety.     ChaWilfred LacyP

## 2022-02-08 NOTE — Assessment & Plan Note (Signed)
Home SBP 140s-150s Asymptomatic BP Readings from Last 3 Encounters:  02/08/22 (!) 150/70  01/30/22 (!) 144/75  01/03/22 (!) 150/80    Start losartan '25mg'$  daily Advised about importance of DASH diet F/up in 37month

## 2022-02-08 NOTE — Patient Instructions (Signed)
Start losartan '25mg'$  daily Maintain DASH diet Try ashwaghanda with magnesium supplement to help with mood and insomnia.  Managing Anxiety, Adult After being diagnosed with anxiety, you may be relieved to know why you have felt or behaved a certain way. You may also feel overwhelmed about the treatment ahead and what it will mean for your life. With care and support, you can manage this condition. How to manage lifestyle changes Managing stress and anxiety  Stress is your body's reaction to life changes and events, both good and bad. Most stress will last just a few hours, but stress can be ongoing and can lead to more than just stress. Although stress can play a major role in anxiety, it is not the same as anxiety. Stress is usually caused by something external, such as a deadline, test, or competition. Stress normally passes after the triggering event has ended.  Anxiety is caused by something internal, such as imagining a terrible outcome or worrying that something will go wrong that will devastate you. Anxiety often does not go away even after the triggering event is over, and it can become long-term (chronic) worry. It is important to understand the differences between stress and anxiety and to manage your stress effectively so that it does not lead to an anxious response. Talk with your health care provider or a counselor to learn more about reducing anxiety and stress. He or she may suggest tension reduction techniques, such as: Music therapy. Spend time creating or listening to music that you enjoy and that inspires you. Mindfulness-based meditation. Practice being aware of your normal breaths while not trying to control your breathing. It can be done while sitting or walking. Centering prayer. This involves focusing on a word, phrase, or sacred image that means something to you and brings you peace. Deep breathing. To do this, expand your stomach and inhale slowly through your nose. Hold your  breath for 3-5 seconds. Then exhale slowly, letting your stomach muscles relax. Self-talk. Learn to notice and identify thought patterns that lead to anxiety reactions and change those patterns to thoughts that feel peaceful. Muscle relaxation. Taking time to tense muscles and then relax them. Choose a tension reduction technique that fits your lifestyle and personality. These techniques take time and practice. Set aside 5-15 minutes a day to do them. Therapists can offer counseling and training in these techniques. The training to help with anxiety may be covered by some insurance plans. Other things you can do to manage stress and anxiety include: Keeping a stress diary. This can help you learn what triggers your reaction and then learn ways to manage your response. Thinking about how you react to certain situations. You may not be able to control everything, but you can control your response. Making time for activities that help you relax and not feeling guilty about spending your time in this way. Doing visual imagery. This involves imagining or creating mental pictures to help you relax. Practicing yoga. Through yoga poses, you can lower tension and promote relaxation.  Medicines Medicines can help ease symptoms. Medicines for anxiety include: Antidepressant medicines. These are usually prescribed for long-term daily control. Anti-anxiety medicines. These may be added in severe cases, especially when panic attacks occur. Medicines will be prescribed by a health care provider. When used together, medicines, psychotherapy, and tension reduction techniques may be the most effective treatment. Relationships Relationships can play a big part in helping you recover. Try to spend more time connecting with trusted friends and  family members. Consider going to couples counseling if you have a partner, taking family education classes, or going to family therapy. Therapy can help you and others better  understand your condition. How to recognize changes in your anxiety Everyone responds differently to treatment for anxiety. Recovery from anxiety happens when symptoms decrease and stop interfering with your daily activities at home or work. This may mean that you will start to: Have better concentration and focus. Worry will interfere less in your daily thinking. Sleep better. Be less irritable. Have more energy. Have improved memory. It is also important to recognize when your condition is getting worse. Contact your health care provider if your symptoms interfere with home or work and you feel like your condition is not improving. Follow these instructions at home: Activity Exercise. Adults should do the following: Exercise for at least 150 minutes each week. The exercise should increase your heart rate and make you sweat (moderate-intensity exercise). Strengthening exercises at least twice a week. Get the right amount and quality of sleep. Most adults need 7-9 hours of sleep each night. Lifestyle  Eat a healthy diet that includes plenty of vegetables, fruits, whole grains, low-fat dairy products, and lean protein. Do not eat a lot of foods that are high in fats, added sugars, or salt (sodium). Make choices that simplify your life. Do not use any products that contain nicotine or tobacco. These products include cigarettes, chewing tobacco, and vaping devices, such as e-cigarettes. If you need help quitting, ask your health care provider. Avoid caffeine, alcohol, and certain over-the-counter cold medicines. These may make you feel worse. Ask your pharmacist which medicines to avoid. General instructions Take over-the-counter and prescription medicines only as told by your health care provider. Keep all follow-up visits. This is important. Where to find support You can get help and support from these sources: Self-help groups. Online and [employer]. A trusted spiritual  leader. Couples counseling. Family education classes. Family therapy. Where to find more information You may find that joining a support group helps you deal with your anxiety. The following sources can help you locate counselors or support groups near you: [place]: www.mentalhealthamerica.net Anxiety and Depression Association of [place] (ADAA): https://www.clark.net/ National Alliance on Mental Illness (NAMI): www.nami.org Contact a health care provider if: You have a hard time staying focused or finishing daily tasks. You spend many hours a day feeling worried about everyday life. You become exhausted by worry. You start to have headaches or frequently feel tense. You develop chronic nausea or diarrhea. Get help right away if: You have a racing heart and shortness of breath. You have thoughts of hurting yourself or others. If you ever feel like you may hurt yourself or others, or have thoughts about taking your own life, get help right away. Go to your nearest emergency department or: Call your local emergency services (911 in the [place] Call a suicide crisis helpline, such as the [facility] at (438)601-4082 or 988 in the [place]. This is open 24 hours a day in the [place] Text the Crisis Text Line at 731-147-6530 (in the [place].). Summary Taking steps to learn and use tension reduction techniques can help calm you and help prevent triggering an anxiety reaction. When used together, medicines, psychotherapy, and tension reduction techniques may be the most effective treatment. Family, friends, and partners can play a big part in supporting you. This information is not intended to replace advice given to you by your health care provider. Make  sure you discuss any questions you have with your health care provider. Document Revised: 08/31/2020 Document Reviewed: 05/29/2020 Elsevier Patient Education  [place].

## 2022-02-08 NOTE — Assessment & Plan Note (Signed)
F/up appt with surgery today at 1:45pm  Reports persistent left leg pain.

## 2022-02-08 NOTE — Assessment & Plan Note (Signed)
Ongoing for years, worse in last 5monthdue to back pain. Associated with insomnia and racing thoughts. No SI/HI or hallucinations.  She agreed to psychology referral. Advised to try OTC Ashwaghanda+magnesium supplement F/up in 160month

## 2022-03-12 ENCOUNTER — Encounter: Payer: Self-pay | Admitting: Nurse Practitioner

## 2022-03-12 ENCOUNTER — Other Ambulatory Visit (HOSPITAL_COMMUNITY)
Admission: RE | Admit: 2022-03-12 | Discharge: 2022-03-12 | Disposition: A | Discharge status: Home / Self Care | Payer: Commercial Managed Care - HMO | Source: Ambulatory Visit | Attending: Nurse Practitioner | Admitting: Nurse Practitioner

## 2022-03-12 ENCOUNTER — Ambulatory Visit (INDEPENDENT_AMBULATORY_CARE_PROVIDER_SITE_OTHER): Payer: Commercial Managed Care - HMO | Admitting: Nurse Practitioner

## 2022-03-12 VITALS — BP 134/64 | HR 93 | Temp 98.1°F | Ht 66.0 in | Wt 157.0 lb

## 2022-03-12 DIAGNOSIS — Z136 Encounter for screening for cardiovascular disorders: Secondary | ICD-10-CM

## 2022-03-12 DIAGNOSIS — Z124 Encounter for screening for malignant neoplasm of cervix: Secondary | ICD-10-CM | POA: Insufficient documentation

## 2022-03-12 DIAGNOSIS — Z23 Encounter for immunization: Secondary | ICD-10-CM

## 2022-03-12 DIAGNOSIS — Z0001 Encounter for general adult medical examination with abnormal findings: Secondary | ICD-10-CM | POA: Diagnosis not present

## 2022-03-12 DIAGNOSIS — I10 Essential (primary) hypertension: Secondary | ICD-10-CM

## 2022-03-12 DIAGNOSIS — D649 Anemia, unspecified: Secondary | ICD-10-CM | POA: Diagnosis not present

## 2022-03-12 DIAGNOSIS — Z13220 Encounter for screening for lipoid disorders: Secondary | ICD-10-CM

## 2022-03-12 MED ORDER — LOSARTAN POTASSIUM 25 MG PO TABS: 25.0000 mg | ORAL_TABLET | Freq: Every day | ORAL | 3 refills | Status: DC

## 2022-03-12 NOTE — Patient Instructions (Addendum)
Schedule fasting lab appt  Preventive Care 21-60 Years Old, Female Preventive care refers to lifestyle choices and visits with your health care provider that can promote health and wellness. Preventive care visits are also called wellness exams. What can I expect for my preventive care visit? Counseling Your health care provider may ask you questions about your: Medical history, including: Past medical problems. Family medical history. Pregnancy history. Current health, including: Menstrual cycle. Method of birth control. Emotional well-being. Home life and relationship well-being. Sexual activity and sexual health. Lifestyle, including: Alcohol, nicotine or tobacco, and drug use. Access to firearms. Diet, exercise, and sleep habits. Work and work Statistician. Sunscreen use. Safety issues such as seatbelt and bike helmet use. Physical exam Your health care provider will check your: Height and weight. These may be used to calculate your BMI (body mass index). BMI is a measurement that tells if you are at a healthy weight. Waist circumference. This measures the distance around your waistline. This measurement also tells if you are at a healthy weight and may help predict your risk of certain diseases, such as type 2 diabetes and high blood pressure. Heart rate and blood pressure. Body temperature. Skin for abnormal spots. What immunizations do I need?  Vaccines are usually given at various ages, according to a schedule. Your health care provider will recommend vaccines for you based on your age, medical history, and lifestyle or other factors, such as travel or where you work. What tests do I need? Screening Your health care provider may recommend screening tests for certain conditions. This may include: Lipid and cholesterol levels. Diabetes screening. This is done by checking your blood sugar (glucose) after you have not eaten for a while (fasting). Pelvic exam and Pap  test. Hepatitis B test. Hepatitis C test. HIV (human immunodeficiency virus) test. STI (sexually transmitted infection) testing, if you are at risk. Lung cancer screening. Colorectal cancer screening. Mammogram. Talk with your health care provider about when you should start having regular mammograms. This may depend on whether you have a family history of breast cancer. BRCA-related cancer screening. This may be done if you have a family history of breast, ovarian, tubal, or peritoneal cancers. Bone density scan. This is done to screen for osteoporosis. Talk with your health care provider about your test results, treatment options, and if necessary, the need for more tests. Follow these instructions at home: Eating and drinking  Eat a diet that includes fresh fruits and vegetables, whole grains, lean protein, and low-fat dairy products. Take vitamin and mineral supplements as recommended by your health care provider. Do not drink alcohol if: Your health care provider tells you not to drink. You are pregnant, may be pregnant, or are planning to become pregnant. If you drink alcohol: Limit how much you have to 0-1 drink a day. Know how much alcohol is in your drink. In the [place] one drink equals one 12 oz bottle of beer (355 mL), one 5 oz glass of wine (148 mL), or one 1 oz glass of hard liquor (44 mL). Lifestyle Brush your teeth every morning and night with fluoride toothpaste. Floss one time each day. Exercise for at least 30 minutes 5 or more days each week. Do not use any products that contain nicotine or tobacco. These products include cigarettes, chewing tobacco, and vaping devices, such as e-cigarettes. If you need help quitting, ask your health care provider. Do not use drugs. If you are sexually active, practice safe sex. Use a condom or  other form of protection to prevent STIs. If you do not wish to become pregnant, use a form of birth control. If you plan to become pregnant,  see your health care provider for a prepregnancy visit. Take aspirin only as told by your health care provider. Make sure that you understand how much to take and what form to take. Work with your health care provider to find out whether it is safe and beneficial for you to take aspirin daily. Find healthy ways to manage stress, such as: Meditation, yoga, or listening to music. Journaling. Talking to a trusted person. Spending time with friends and family. Minimize exposure to UV radiation to reduce your risk of skin cancer. Safety Always wear your seat belt while driving or riding in a vehicle. Do not drive: If you have been drinking alcohol. Do not ride with someone who has been drinking. When you are tired or distracted. While texting. If you have been using any mind-altering substances or drugs. Wear a helmet and other protective equipment during sports activities. If you have firearms in your house, make sure you follow all gun safety procedures. Seek help if you have been physically or sexually abused. What's next? Visit your health care provider once a year for an annual wellness visit. Ask your health care provider how often you should have your eyes and teeth checked. Stay up to date on all vaccines. This information is not intended to replace advice given to you by your health care provider. Make sure you discuss any questions you have with your health care provider. Document Revised: 08/03/2020 Document Reviewed: 08/03/2020 Elsevier Patient Education  [place].

## 2022-03-12 NOTE — Assessment & Plan Note (Signed)
Resolved back and leg pain.

## 2022-03-12 NOTE — Progress Notes (Signed)
Complete physical exam  Patient: Julie Mcdonald   DOB: [birth year]   62 y.o. Female  MRN: [id number] Visit Date: 03/12/2022  Subjective:    Chief Complaint  Patient presents with   Annual Exam    CPE/ HTN Pt not fasting Doesn't;check BP No concerns  Shingles vaccine given today     Julie Mcdonald is a 62 y.o. female who presents today for a complete physical exam. She reports consuming a low fat and low sodium diet.  Walking daily  She generally feels well. She reports sleeping well. She does not have additional problems to discuss today.  Vision:Yes Dental:Yes STD Screen:No  BP Readings from Last 3 Encounters:  03/12/22 134/64  02/08/22 (!) 150/70  01/30/22 (!) 144/75   Wt Readings from Last 3 Encounters:  03/12/22 157 lb (71.2 kg)  02/08/22 160 lb 3.2 oz (72.7 kg)  01/29/22 153 lb (69.4 kg)   Most recent fall risk assessment:    02/08/2022   11:26 AM  [place] in the past year? 0  Number falls in past yr: 0  Injury with Fall? 0     Depression screen:Yes - No Depression  Most recent depression screenings:    03/12/2022    1:27 PM 02/08/2022   12:13 PM  PHQ 2/9 Scores  PHQ - 2 Score 0 6  PHQ- 9 Score 7 22    HPI  HTN (hypertension) BP at goal with losartan Denies any adverse side effects BP Readings from Last 3 Encounters:  03/12/22 134/64  02/08/22 (!) 150/70  01/30/22 (!) 144/75    Maintain med dose  S/P diskectomy Resolved back and leg pain.   Past Medical History:  Diagnosis Date   Allergy    Anemia    Anxiety    Arthritis    GERD (gastroesophageal reflux disease)    Hx of adenomatous polyp of colon 11/28/2020   Hypertension    Palpitations    CTA 2020 normal no obstruction   Seasonal allergies    Wears glasses    Past Surgical History:  Procedure Laterality Date   BREAST LUMPECTOMY WITH NEEDLE LOCALIZATION  01/23/2012   Procedure: BREAST LUMPECTOMY WITH NEEDLE LOCALIZATION;  Surgeon: Merrie Roof, MD;  Location:  [facility];  Service: General;  Laterality: Right;   BREAST SURGERY  02/20/1988   right breast removed cyst   COLONOSCOPY     last 1022 w/ 1 adenoma   LUMBAR LAMINECTOMY/DECOMPRESSION MICRODISCECTOMY Left 01/29/2022   Procedure: Left - Lumbar Three-Four extraforaminal microdiskectomy;  Surgeon: Kary Kos, MD;  Location: [facility];  Service: Neurosurgery;  Laterality: Left;  3C   TUBAL LIGATION     Social History   Socioeconomic History   Marital status: Married    Spouse name: Not on file   Number of children: 1   Years of education: Not on file   Highest education level: Not on file  Occupational History   Not on file  Tobacco Use   Smoking status: Never   Smokeless tobacco: Never  Vaping Use   Vaping Use: Never used  Substance and Sexual Activity   Alcohol use: Not Currently   Drug use: No   Sexual activity: Yes    Birth control/protection: Surgical  Other Topics Concern   Not on file  Social History Narrative   Lives locally with husband.  Works as Theme park manager.  Very active and works out in gym with trainer several x/wk.  Social Determinants of Health   Financial Resource Strain: Not on file  Food Insecurity: Not on file  Transportation Needs: Not on file  Physical Activity: Not on file  Stress: Not on file  Social Connections: Not on file  Intimate Partner Violence: Not on file   Family Status  Relation Name Status   Mother  Alive   Father  Deceased   Mat Aunt  (Not Specified)   Annamarie Major  (Not Specified)   MGM  Deceased   MGF  Deceased   Maternal GGM  Alive   Cousin  Deceased   Neg Hx  (Not Specified)   Family History  Problem Relation Age of Onset   Heart disease Mother    Hypertension Mother    Breast cancer Maternal Aunt 79   Depression Paternal Uncle    Diabetes Maternal Grandmother    Heart disease Maternal Grandmother    Cancer Maternal Grandmother        colon   Diabetes Maternal Grandfather    Heart disease Maternal  Grandfather    Cancer Maternal Grandfather    Colon cancer Maternal Great-grandmother    Suicidality Cousin    Esophageal cancer Neg Hx    Colon polyps Neg Hx    Rectal cancer Neg Hx    Stomach cancer Neg Hx    No Known Allergies  Patient Care Team: Elliot Meldrum, Charlene Brooke, NP as PCP - General (Internal Medicine) Delsa Bern, MD as Consulting Physician (Obstetrics and Gynecology)   Medications: Outpatient Medications Prior to Visit  Medication Sig   acetaminophen (TYLENOL) 500 MG tablet Take 1,000 mg by mouth every 6 (six) hours as needed for moderate pain.   cetirizine (ZYRTEC) 10 MG tablet Take 10 mg by mouth daily.   levocetirizine (XYZAL) 5 MG tablet Take 5 mg by mouth every evening.   Multiple Vitamins-Minerals (EMERGEN-C VITAMIN C) PACK Take 1 Package by mouth daily.   Probiotic Product (PROBIOTIC DAILY PO) Take 1 tablet by mouth daily.   [DISCONTINUED] losartan (COZAAR) 25 MG tablet Take 1 tablet (25 mg total) by mouth daily.   [DISCONTINUED] HYDROcodone-acetaminophen (NORCO/VICODIN) 5-325 MG tablet Take 2 tablets by mouth every 4 (four) hours as needed for severe pain ((score 7 to 10)). (Patient not taking: Reported on 03/12/2022)   [DISCONTINUED] methocarbamol (ROBAXIN) 500 MG tablet Take 1 tablet (500 mg total) by mouth 2 (two) times daily. (Patient not taking: Reported on 03/12/2022)   No facility-administered medications prior to visit.    Review of Systems  Last CBC Lab Results  Component Value Date   WBC 5.8 01/22/2022   HGB 11.7 (L) 01/22/2022   HCT 36.7 01/22/2022   MCV 82.8 01/22/2022   MCH 26.4 01/22/2022   RDW 13.9 01/22/2022   PLT 272 32/67/1245   Last metabolic panel Lab Results  Component Value Date   GLUCOSE 90 01/22/2022   NA 144 01/22/2022   K 3.5 01/22/2022   CL 108 01/22/2022   CO2 28 01/22/2022   BUN 10 01/22/2022   CREATININE 0.72 01/22/2022   GFRNONAA >60 01/22/2022   CALCIUM 9.5 01/22/2022   PROT 7.5 06/04/2014   ALBUMIN 4.1  06/04/2014   BILITOT 0.6 06/04/2014   ALKPHOS 91 06/04/2014   AST 20 06/04/2014   ALT 15 06/04/2014   ANIONGAP 8 01/22/2022        Objective:  BP 134/64 (BP Location: Right Arm, Patient Position: Sitting, Cuff Size: Normal)   Pulse 93   Temp 98.1 F (36.7 C) (  Temporal)   Ht '5\' 6"'$  (1.676 m)   Wt 157 lb (71.2 kg)   LMP 03/22/2012   SpO2 99%   BMI 25.34 kg/m     Physical Exam Vitals and nursing note reviewed. Exam conducted with a chaperone present.  Constitutional:      General: She is not in acute distress.    Appearance: She is well-developed.  HENT:     Right Ear: Tympanic membrane, ear canal and external ear normal.     Left Ear: Tympanic membrane, ear canal and external ear normal.     Nose: Nose normal.     Mouth/Throat:     Pharynx: No oropharyngeal exudate.  Eyes:     Extraocular Movements: Extraocular movements intact.     Conjunctiva/sclera: Conjunctivae normal.     Pupils: Pupils are equal, round, and reactive to light.  Cardiovascular:     Rate and Rhythm: Normal rate and regular rhythm.     Pulses: Normal pulses.     Heart sounds: Normal heart sounds.  Pulmonary:     Effort: Pulmonary effort is normal. No respiratory distress.     Breath sounds: Normal breath sounds.  Chest:     Chest wall: No tenderness.  Breasts:    Breasts are symmetrical.     Right: Normal.     Left: Normal.  Abdominal:     General: Bowel sounds are normal.     Palpations: Abdomen is soft.     Hernia: There is no hernia in the left inguinal area or right inguinal area.  Genitourinary:    General: Normal vulva.     Exam position: Lithotomy position.     Labia:        Right: No rash, tenderness or lesion.        Left: No rash, tenderness or lesion.      Vagina: Normal. No vaginal discharge.     Cervix: Normal.     Uterus: Normal.      Adnexa: Right adnexa normal and left adnexa normal.  Musculoskeletal:        General: Normal range of motion.     Cervical back: Normal  range of motion and neck supple.     Right lower leg: No edema.     Left lower leg: No edema.  Lymphadenopathy:     Upper Body:     Right upper body: No supraclavicular, axillary or pectoral adenopathy.     Left upper body: No supraclavicular, axillary or pectoral adenopathy.     Lower Body: No right inguinal adenopathy. No left inguinal adenopathy.  Skin:    General: Skin is warm and dry.  Neurological:     Mental Status: She is alert and oriented to person, place, and time.     Deep Tendon Reflexes: Reflexes are normal and symmetric.  Psychiatric:        Mood and Affect: Mood normal.        Behavior: Behavior normal.        Thought Content: Thought content normal.      No results found for any visits on 03/12/22.    Assessment & Plan:    Routine Health Maintenance and Physical Exam  Immunization History  Administered Date(s) Administered   Influenza-Unspecified 11/19/2021   Zoster Recombinat (Shingrix) 10/30/2021, 03/12/2022    Health Maintenance  Topic Date Due   PAP SMEAR-Modifier  04/21/2016   COVID-19 Vaccine (1) 03/28/2022 (Originally 11/10/1960)   Hepatitis C Screening  03/13/2023 (Originally 05/09/1980)  HIV Screening  03/13/2023 (Originally [birth year]09/1977)   MAMMOGRAM  12/12/2023   COLONOSCOPY (Pts 45-36yr Insurance coverage will need to be confirmed)  11/25/2027   INFLUENZA VACCINE  Completed   Zoster Vaccines- Shingrix  Completed   HPV VACCINES  Aged Out   DTaP/Tdap/Td  Discontinued    Discussed health benefits of physical activity, and encouraged her to engage in regular exercise appropriate for her age and condition.  Problem List Items Addressed This Visit       Cardiovascular and Mediastinum   HTN (hypertension)    BP at goal with losartan Denies any adverse side effects BP Readings from Last 3 Encounters:  03/12/22 134/64  02/08/22 (!) 150/70  01/30/22 (!) 144/75    Maintain med dose      Relevant Medications   losartan (COZAAR) 25 MG tablet      Other   Normocytic anemia   Other Visit Diagnoses     Encounter for preventative adult health care exam with abnormal findings    -  Primary   Relevant Orders   CBC   Comprehensive metabolic panel   Lipid panel   Cytology - PAP   Encounter for Papanicolaou smear for cervical cancer screening       Relevant Orders   Cytology - PAP   Need for zoster vaccination       Relevant Orders   Zoster Recombinant (Shingrix ) (Completed)   Encounter for lipid screening for cardiovascular disease       Relevant Orders   Lipid panel      Return in about 6 months (around 09/10/2022) for HTN.     CWilfred Lacy NP

## 2022-03-12 NOTE — Assessment & Plan Note (Signed)
BP at goal with losartan Denies any adverse side effects BP Readings from Last 3 Encounters:  03/12/22 134/64  02/08/22 (!) 150/70  01/30/22 (!) 144/75    Maintain med dose

## 2022-03-16 LAB — CYTOLOGY - PAP
Comment: NEGATIVE
Diagnosis: NEGATIVE
High risk HPV: NEGATIVE

## 2022-03-20 ENCOUNTER — Other Ambulatory Visit: Payer: Commercial Managed Care - HMO

## 2022-03-21 ENCOUNTER — Other Ambulatory Visit (INDEPENDENT_AMBULATORY_CARE_PROVIDER_SITE_OTHER): Payer: Commercial Managed Care - HMO

## 2022-03-21 DIAGNOSIS — Z136 Encounter for screening for cardiovascular disorders: Secondary | ICD-10-CM | POA: Diagnosis not present

## 2022-03-21 DIAGNOSIS — Z13220 Encounter for screening for lipoid disorders: Secondary | ICD-10-CM

## 2022-03-21 DIAGNOSIS — Z0001 Encounter for general adult medical examination with abnormal findings: Secondary | ICD-10-CM

## 2022-03-21 LAB — CBC
HCT: 37.2 % (ref 36.0–46.0)
Hemoglobin: 11.9 g/dL — ABNORMAL LOW (ref 12.0–15.0)
MCHC: 32 g/dL (ref 30.0–36.0)
MCV: 82.5 fl (ref 78.0–100.0)
Platelets: 285 10*3/uL (ref 150.0–400.0)
RBC: 4.51 Mil/uL (ref 3.87–5.11)
RDW: 13.1 % (ref 11.5–15.5)
WBC: 5.1 10*3/uL (ref 4.0–10.5)

## 2022-03-21 LAB — COMPREHENSIVE METABOLIC PANEL
ALT: 13 U/L (ref 0–35)
AST: 16 U/L (ref 0–37)
Albumin: 4.7 g/dL (ref 3.5–5.2)
Alkaline Phosphatase: 93 U/L (ref 39–117)
BUN: 11 mg/dL (ref 6–23)
CO2: 29 mEq/L (ref 19–32)
Calcium: 9.5 mg/dL (ref 8.4–10.5)
Chloride: 104 mEq/L (ref 96–112)
Creatinine, Ser: 0.75 mg/dL (ref 0.40–1.20)
GFR: 86.87 mL/min (ref >60.00)
Glucose, Bld: 82 mg/dL (ref 70–99)
Potassium: 3.4 mEq/L — ABNORMAL LOW (ref 3.5–5.1)
Sodium: 142 mEq/L (ref 135–145)
Total Bilirubin: 0.6 mg/dL (ref 0.2–1.2)
Total Protein: 7.4 g/dL (ref 6.0–8.3)

## 2022-03-21 LAB — LIPID PANEL
Cholesterol: 184 mg/dL (ref 0–200)
HDL: 65.7 mg/dL (ref >39.00)
LDL Cholesterol: 102 mg/dL — ABNORMAL HIGH (ref 0–99)
NonHDL: 118.51
Total CHOL/HDL Ratio: 3
Triglycerides: 84 mg/dL (ref 0.0–149.0)
VLDL: 16.8 mg/dL (ref 0.0–40.0)

## 2022-06-27 ENCOUNTER — Ambulatory Visit: Payer: Commercial Managed Care - HMO | Admitting: Nurse Practitioner

## 2022-06-27 ENCOUNTER — Encounter: Payer: Self-pay | Admitting: Nurse Practitioner

## 2022-06-27 ENCOUNTER — Telehealth: Payer: Self-pay | Admitting: Nurse Practitioner

## 2022-06-27 VITALS — BP 160/100 | HR 74 | Temp 97.7°F | Resp 16 | Ht 66.0 in | Wt 152.4 lb

## 2022-06-27 DIAGNOSIS — F411 Generalized anxiety disorder: Secondary | ICD-10-CM | POA: Diagnosis not present

## 2022-06-27 MED ORDER — HYDROXYZINE PAMOATE 25 MG PO CAPS: 25.0000 mg | ORAL_CAPSULE | Freq: Every evening | ORAL | 0 refills | Status: AC | PRN

## 2022-06-27 MED ORDER — ESCITALOPRAM OXALATE 10 MG PO TABS: 10.0000 mg | ORAL_TABLET | Freq: Every day | ORAL | 5 refills | Status: AC

## 2022-06-27 NOTE — Progress Notes (Signed)
Established Patient Visit  Patient: Julie Mcdonald   DOB: 12/27/1960   62 y.o. Female  MRN: [id number] Visit Date: 07/02/2022  Subjective:    Chief Complaint  Patient presents with   Anxiety    Pt feels she is anxious and is all over the place mentally. Plus she is not sleeping well    HPI GAD (generalized anxiety disorder) Worsening anxiety with recent seperation from work and increase demand at work. Reports increased irritability, insomnia, restlessness, and Elevated BP Daily  ETOH consumption at night to help with sleep No tobacco use, no caffeine, no illicit drug use Denies and SI/HI/hallucinations. No improvement with OTC supplement and meditation. Did not schedule appt with psychology.  We discussed use of SSRI vs SNRI vs buspar vs vistaril and their possible side effects. We discussed common side effects such as nausea, drowsiness and weight gain.  Also discussed rare but serious side effect of suicide ideation.  She is instructed to discontinue medication go directly to ED if this occurs.  Pt verbalizes understanding.   Advised to stop ETOH use She agreed to start lexapro and vistaril Plan follow up in 1 month Provided phone number to schedule appt with therapist.  BP Readings from Last 3 Encounters:  06/27/22 (!) 160/100  03/12/22 134/64  02/08/22 (!) 150/70   Wt Readings from Last 3 Encounters:  06/27/22 152 lb 6.4 oz (69.1 kg)  03/12/22 157 lb (71.2 kg)  02/08/22 160 lb 3.2 oz (72.7 kg)      Reviewed medical, surgical, and social history today  Medications: Outpatient Medications Prior to Visit  Medication Sig   acetaminophen (TYLENOL) 500 MG tablet Take 1,000 mg by mouth every 6 (six) hours as needed for moderate pain.   cetirizine (ZYRTEC) 10 MG tablet Take 10 mg by mouth daily.   levocetirizine (XYZAL) 5 MG tablet Take 5 mg by mouth every evening.   losartan (COZAAR) 25 MG tablet Take 1 tablet (25 mg total) by mouth daily.   Multiple  Vitamins-Minerals (EMERGEN-C VITAMIN C) PACK Take 1 Package by mouth daily.   Probiotic Product (PROBIOTIC DAILY PO) Take 1 tablet by mouth daily.   No facility-administered medications prior to visit.   Reviewed past medical and social history.   ROS per HPI above  Last CBC Lab Results  Component Value Date   WBC 5.1 03/21/2022   HGB 11.9 (L) 03/21/2022   HCT 37.2 03/21/2022   MCV 82.5 03/21/2022   MCH 26.4 01/22/2022   RDW 13.1 03/21/2022   PLT 285.0 03/21/2022   Last metabolic panel Lab Results  Component Value Date   GLUCOSE 82 03/21/2022   NA 142 03/21/2022   K 3.4 (L) 03/21/2022   CL 104 03/21/2022   CO2 29 03/21/2022   BUN 11 03/21/2022   CREATININE 0.75 03/21/2022   GFRNONAA >60 01/22/2022   CALCIUM 9.5 03/21/2022   PROT 7.4 03/21/2022   ALBUMIN 4.7 03/21/2022   BILITOT 0.6 03/21/2022   ALKPHOS 93 03/21/2022   AST 16 03/21/2022   ALT 13 03/21/2022   ANIONGAP 8 01/22/2022   Last thyroid functions No results found for: "TSH", "T3TOTAL", "T4TOTAL", "THYROIDAB"      Objective:  BP (!) 160/100 (BP Location: Left Arm, Patient Position: Sitting, Cuff Size: Normal)   Pulse 74   Temp 97.7 F (36.5 C) (Temporal)   Resp 16   Ht 5\' 6"  (1.676 m)  Wt 152 lb 6.4 oz (69.1 kg)   LMP 03/22/2012   SpO2 100%   BMI 24.60 kg/m      Physical Exam Psychiatric:        Attention and Perception: Attention normal.        Mood and Affect: Mood is anxious.        Speech: Speech normal.        Behavior: Behavior is cooperative.        Thought Content: Thought content normal.        Cognition and Memory: Cognition normal.     No results found for any visits on 06/27/22.    Assessment & Plan:    Problem List Items Addressed This Visit       Other   GAD (generalized anxiety disorder) - Primary    Worsening anxiety with recent seperation from work and increase demand at work. Reports increased irritability, insomnia, restlessness, and Elevated BP Daily  ETOH  consumption at night to help with sleep No tobacco use, no caffeine, no illicit drug use Denies and SI/HI/hallucinations. No improvement with OTC supplement and meditation. Did not schedule appt with psychology.  We discussed use of SSRI vs SNRI vs buspar vs vistaril and their possible side effects. We discussed common side effects such as nausea, drowsiness and weight gain.  Also discussed rare but serious side effect of suicide ideation.  She is instructed to discontinue medication go directly to ED if this occurs.  Pt verbalizes understanding.   Advised to stop ETOH use She agreed to start lexapro and vistaril Plan follow up in 1 month Provided phone number to schedule appt with therapist.      Relevant Medications   escitalopram (LEXAPRO) 10 MG tablet   hydrOXYzine (VISTARIL) 25 MG capsule   Return in about 4 weeks (around 07/25/2022) for depression and anxiety.     Alysia Penna, NP

## 2022-06-27 NOTE — Telephone Encounter (Signed)
[facility] 

## 2022-06-27 NOTE — Patient Instructions (Addendum)
Schedule appt with psychologist. Start 1/2 tablet once daily for 1 week and then increase to a full tablet once daily continuously  We discussed common side effects such as nausea, drowsiness and weight gain.  Also discussed rare but serious side effect of suicide ideation.  She is instructed to discontinue medication go directly to ED if this occurs.  Pt verbalizes understanding.   Plan follow up in 1 month to evaluate progress.     Managing Anxiety, Adult After being diagnosed with anxiety, you may be relieved to know why you have felt or behaved a certain way. You may also feel overwhelmed about the treatment ahead and what it will mean for your life. With care and support, you can manage your anxiety. How to manage lifestyle changes Understanding the difference between stress and anxiety Although stress can play a role in anxiety, it is not the same as anxiety. Stress is your body's reaction to life changes and events, both good and bad. Stress is often caused by something external, such as a deadline, test, or competition. It normally goes away after the event has ended and will last just a few hours. But, stress can be ongoing and can lead to more than just stress. Anxiety is caused by something internal, such as imagining a terrible outcome or worrying that something will go wrong that will greatly upset you. Anxiety often does not go away even after the event is over, and it can become a long-term (chronic) worry. Lowering stress and anxiety Talk with your health care provider or a counselor to learn more about lowering anxiety and stress. They may suggest tension-reduction techniques, such as: Music. Spend time creating or listening to music that you enjoy and that inspires you. Mindfulness-based meditation. Practice being aware of your normal breaths while not trying to control your breathing. It can be done while sitting or walking. Centering prayer. Focus on a word, phrase, or sacred image  that means something to you and brings you peace. Deep breathing. Expand your stomach and inhale slowly through your nose. Hold your breath for 3-5 seconds. Then breathe out slowly, letting your stomach muscles relax. Self-talk. Learn to notice and spot thought patterns that lead to anxiety reactions. Change those patterns to thoughts that feel peaceful. Muscle relaxation. Take time to tense muscles and then relax them. Choose a tension-reduction technique that fits your lifestyle and personality. These techniques take time and practice. Set aside 5-15 minutes a day to do them. Specialized therapists can offer counseling and training in these techniques. The training to help with anxiety may be covered by some insurance plans. Other things you can do to manage stress and anxiety include: Keeping a stress diary. This can help you learn what triggers your reaction and then learn ways to manage your response. Thinking about how you react to certain situations. You may not be able to control everything, but you can control your response. Making time for activities that help you relax and not feeling guilty about spending your time in this way. Doing visual imagery. This involves imagining or creating mental pictures to help you relax. Practicing yoga. Through yoga poses, you can lower tension and relax.  Medicines Medicines for anxiety include: Antidepressant medicines. These are usually prescribed for long-term daily control. Anti-anxiety medicines. These may be added in severe cases, especially when panic attacks occur. When used together, medicines, psychotherapy, and tension-reduction techniques may be the most effective treatment. Relationships Relationships can play a big part in helping  you recover. Spend more time connecting with trusted friends and family members. Think about going to couples counseling if you have a partner, taking family education classes, or going to family therapy. Therapy  can help you and others better understand your anxiety. How to recognize changes in your anxiety Everyone responds differently to treatment for anxiety. Recovery from anxiety happens when symptoms lessen and stop interfering with your daily life at home or work. This may mean that you will start to: Have better concentration and focus. Worry will interfere less in your daily thinking. Sleep better. Be less irritable. Have more energy. Have improved memory. Try to recognize when your condition is getting worse. Contact your provider if your symptoms interfere with home or work and you feel like your condition is not improving. Follow these instructions at home: Activity Exercise. Adults should: Exercise for at least 150 minutes each week. The exercise should increase your heart rate and make you sweat (moderate-intensity exercise). Do strengthening exercises at least twice a week. Get the right amount and quality of sleep. Most adults need 7-9 hours of sleep each night. Lifestyle  Eat a healthy diet that includes plenty of vegetables, fruits, whole grains, low-fat dairy products, and lean protein. Do not eat a lot of foods that are high in fats, added sugars, or salt (sodium). Make choices that simplify your life. Do not use any products that contain nicotine or tobacco. These products include cigarettes, chewing tobacco, and vaping devices, such as e-cigarettes. If you need help quitting, ask your provider. Avoid caffeine, alcohol, and certain over-the-counter cold medicines. These may make you feel worse. Ask your pharmacist which medicines to avoid. General instructions Take over-the-counter and prescription medicines only as told by your provider. Keep all follow-up visits. This is to make sure you are managing your anxiety well or if you need more support. Where to find support You can get help and support from: Self-help groups. Online and [employer]. A trusted  spiritual leader. Couples counseling. Family education classes. Family therapy. Where to find more information You may find that joining a support group helps you deal with your anxiety. The following sources can help you find counselors or support groups near you: Mental Health [place]: mentalhealthamerica.net Anxiety and Depression Association of [place] (ADAA): adaa.org [employer] Mental Illness (NAMI): nami.org Contact a health care provider if: You have a hard time staying focused or finishing tasks. You spend many hours a day feeling worried about everyday life. You are very tired because you cannot stop worrying. You start to have headaches or often feel tense. You have chronic nausea or diarrhea. Get help right away if: Your heart feels like it is racing. You have shortness of breath. You have thoughts of hurting yourself or others. Get help right away if you feel like you may hurt yourself or others, or have thoughts about taking your own life. Go to your nearest emergency room or: Call 911. Call the National Suicide Prevention Lifeline at 260-600-8872 or 988. This is open 24 hours a day. Text the Crisis Text Line at 559 163 3090. This information is not intended to replace advice given to you by your health care provider. Make sure you discuss any questions you have with your health care provider. Document Revised: 11/14/2021 Document Reviewed: 05/29/2020 Elsevier Patient Education  2023 [employer].

## 2022-06-27 NOTE — Telephone Encounter (Signed)
Pt called stating she wanted as different provider due to not following through. She is speaking of a referral and never hearing her labs.

## 2022-07-02 ENCOUNTER — Telehealth: Payer: Self-pay | Admitting: Nurse Practitioner

## 2022-07-02 DIAGNOSIS — I10 Essential (primary) hypertension: Secondary | ICD-10-CM

## 2022-07-02 NOTE — Telephone Encounter (Signed)
See not °

## 2022-07-02 NOTE — Assessment & Plan Note (Addendum)
Worsening anxiety with recent seperation from work and increase demand at work. Reports increased irritability, insomnia, restlessness, and Elevated BP Daily  ETOH consumption at night to help with sleep No tobacco use, no caffeine, no illicit drug use Denies and SI/HI/hallucinations. No improvement with OTC supplement and meditation. Did not schedule appt with psychology.  We discussed use of SSRI vs SNRI vs buspar vs vistaril and their possible side effects. We discussed common side effects such as nausea, drowsiness and weight gain.  Also discussed rare but serious side effect of suicide ideation.  She is instructed to discontinue medication go directly to ED if this occurs.  Pt verbalizes understanding.   Advised to stop ETOH use She agreed to start lexapro and vistaril Plan follow up in 1 month Provided phone number to schedule appt with therapist.

## 2022-07-03 MED ORDER — LOSARTAN POTASSIUM 50 MG PO TABS: 50.0000 mg | ORAL_TABLET | Freq: Every day | ORAL | 1 refills | Status: AC

## 2022-07-03 NOTE — Assessment & Plan Note (Addendum)
Home BP 160s-150s/100s Advised to increase losartan dose to 50mg  daily, monitor BP 3x/week in AM, send BP readings via mychart in 2weeks

## 2022-07-03 NOTE — Addendum Note (Signed)
Addended by: Michaela Corner on: 07/03/2022 03:13 PM   Modules accepted: Orders

## 2022-07-03 NOTE — Telephone Encounter (Signed)
Pt called back and said she really hasn't been checking her BP consistently.  The last time she checked was and her bp was still high.  It has been around 150-160 over 100.

## 2022-07-03 NOTE — Telephone Encounter (Signed)
Advised to increase losartan dose to 50mg  daily, monitor BP 3x/week in AM, send BP readings via mychart in 2weeks

## 2022-07-03 NOTE — Telephone Encounter (Signed)
[facility] 

## 2022-07-11 NOTE — Telephone Encounter (Signed)
I presume this issue was resolved at the pt visit 06/27/2022 as pt scheduled appt 6/5 for f/u. Please advise.

## 2022-07-25 ENCOUNTER — Ambulatory Visit: Payer: Commercial Managed Care - HMO | Admitting: Nurse Practitioner
